# Patient Record
Sex: Female | Born: 1965 | Race: Black or African American | Hispanic: No | Marital: Married | Smoking: Never smoker
Health system: Southern US, Community
[De-identification: ages and names within clinical notes are randomized; demographics above are authoritative.]

## PROBLEM LIST (undated history)

## (undated) DIAGNOSIS — J45909 Unspecified asthma, uncomplicated: Secondary | ICD-10-CM

## (undated) DIAGNOSIS — R569 Unspecified convulsions: Secondary | ICD-10-CM

## (undated) DIAGNOSIS — F410 Panic disorder [episodic paroxysmal anxiety] without agoraphobia: Secondary | ICD-10-CM

## (undated) HISTORY — PX: COLON SURGERY: SHX602

---

## 2009-06-08 ENCOUNTER — Emergency Department (HOSPITAL_BASED_OUTPATIENT_CLINIC_OR_DEPARTMENT_OTHER): Admission: EM | Admit: 2009-06-08 | Discharge: 2009-06-08 | Payer: Self-pay | Admitting: Emergency Medicine

## 2010-07-04 LAB — DIFFERENTIAL
Basophils Relative: 1 % (ref 0–1)
Eosinophils Absolute: 0.1 10*3/uL (ref 0.0–0.7)
Monocytes Relative: 6 % (ref 3–12)
Neutrophils Relative %: 67 % (ref 43–77)

## 2010-07-04 LAB — BASIC METABOLIC PANEL
BUN: 16 mg/dL (ref 6–23)
CO2: 25 mEq/L (ref 19–32)
Chloride: 112 mEq/L (ref 96–112)
Creatinine, Ser: 1.1 mg/dL (ref 0.4–1.2)
Glucose, Bld: 112 mg/dL — ABNORMAL HIGH (ref 70–99)

## 2010-07-04 LAB — URINALYSIS, ROUTINE W REFLEX MICROSCOPIC
Bilirubin Urine: NEGATIVE
Specific Gravity, Urine: 1.029 (ref 1.005–1.030)
Urobilinogen, UA: 1 mg/dL (ref 0.0–1.0)

## 2010-07-04 LAB — TSH: TSH: 0.948 u[IU]/mL (ref 0.350–4.500)

## 2010-07-04 LAB — URINE MICROSCOPIC-ADD ON

## 2010-07-04 LAB — CBC
MCHC: 34.3 g/dL (ref 30.0–36.0)
MCV: 83.2 fL (ref 78.0–100.0)
Platelets: 227 10*3/uL (ref 150–400)

## 2010-07-04 LAB — CARBAMAZEPINE LEVEL, TOTAL: Carbamazepine Lvl: 8.5 ug/mL (ref 4.0–12.0)

## 2012-04-11 ENCOUNTER — Emergency Department (HOSPITAL_BASED_OUTPATIENT_CLINIC_OR_DEPARTMENT_OTHER): Payer: 59

## 2012-04-11 ENCOUNTER — Encounter (HOSPITAL_BASED_OUTPATIENT_CLINIC_OR_DEPARTMENT_OTHER): Payer: Self-pay | Admitting: *Deleted

## 2012-04-11 ENCOUNTER — Emergency Department (HOSPITAL_BASED_OUTPATIENT_CLINIC_OR_DEPARTMENT_OTHER)
Admission: EM | Admit: 2012-04-11 | Discharge: 2012-04-11 | Disposition: A | Discharge status: Home / Self Care | Payer: 59 | Attending: Emergency Medicine | Admitting: Emergency Medicine

## 2012-04-11 DIAGNOSIS — Z9889 Other specified postprocedural states: Secondary | ICD-10-CM | POA: Insufficient documentation

## 2012-04-11 DIAGNOSIS — K921 Melena: Secondary | ICD-10-CM | POA: Insufficient documentation

## 2012-04-11 DIAGNOSIS — K5289 Other specified noninfective gastroenteritis and colitis: Secondary | ICD-10-CM | POA: Insufficient documentation

## 2012-04-11 DIAGNOSIS — R1110 Vomiting, unspecified: Secondary | ICD-10-CM | POA: Insufficient documentation

## 2012-04-11 DIAGNOSIS — K529 Noninfective gastroenteritis and colitis, unspecified: Secondary | ICD-10-CM

## 2012-04-11 DIAGNOSIS — Z8719 Personal history of other diseases of the digestive system: Secondary | ICD-10-CM | POA: Insufficient documentation

## 2012-04-11 DIAGNOSIS — G40909 Epilepsy, unspecified, not intractable, without status epilepticus: Secondary | ICD-10-CM | POA: Insufficient documentation

## 2012-04-11 DIAGNOSIS — R197 Diarrhea, unspecified: Secondary | ICD-10-CM | POA: Insufficient documentation

## 2012-04-11 DIAGNOSIS — Z3202 Encounter for pregnancy test, result negative: Secondary | ICD-10-CM | POA: Insufficient documentation

## 2012-04-11 DIAGNOSIS — Z79899 Other long term (current) drug therapy: Secondary | ICD-10-CM | POA: Insufficient documentation

## 2012-04-11 HISTORY — DX: Unspecified convulsions: R56.9

## 2012-04-11 LAB — URINE MICROSCOPIC-ADD ON

## 2012-04-11 LAB — BASIC METABOLIC PANEL
Calcium: 9.1 mg/dL (ref 8.4–10.5)
Creatinine, Ser: 1 mg/dL (ref 0.50–1.10)
GFR calc non Af Amer: 67 mL/min — ABNORMAL LOW (ref >90)
Sodium: 136 mEq/L (ref 135–145)

## 2012-04-11 LAB — URINALYSIS, ROUTINE W REFLEX MICROSCOPIC
Ketones, ur: NEGATIVE mg/dL
Leukocytes, UA: NEGATIVE
Nitrite: NEGATIVE
Protein, ur: 30 mg/dL — AB
Urobilinogen, UA: 0.2 mg/dL (ref 0.0–1.0)
pH: 5 (ref 5.0–8.0)

## 2012-04-11 LAB — CBC WITH DIFFERENTIAL/PLATELET
Basophils Relative: 0 % (ref 0–1)
Eosinophils Absolute: 0.3 10*3/uL (ref 0.0–0.7)
Eosinophils Relative: 4 % (ref 0–5)
MCH: 27.1 pg (ref 26.0–34.0)
MCHC: 33.6 g/dL (ref 30.0–36.0)
Neutrophils Relative %: 55 % (ref 43–77)
Platelets: 276 10*3/uL (ref 150–400)
RBC: 4.69 MIL/uL (ref 3.87–5.11)
RDW: 13.6 % (ref 11.5–15.5)

## 2012-04-11 MED ORDER — IOHEXOL 300 MG/ML  SOLN
100.0000 mL | Freq: Once | INTRAMUSCULAR | Status: AC | PRN
  Administered 2012-04-11: 100 mL via INTRAVENOUS

## 2012-04-11 MED ORDER — ONDANSETRON 4 MG PO TBDP: 4.0000 mg | ORAL_TABLET | Freq: Three times a day (TID) | ORAL | Status: DC | PRN

## 2012-04-11 MED ORDER — CIPROFLOXACIN IN D5W 400 MG/200ML IV SOLN
400.0000 mg | Freq: Two times a day (BID) | INTRAVENOUS | Status: DC
  Administered 2012-04-11: 400 mg via INTRAVENOUS
  Filled 2012-04-11: qty 200

## 2012-04-11 MED ORDER — METRONIDAZOLE 500 MG PO TABS: 500.0000 mg | ORAL_TABLET | Freq: Two times a day (BID) | ORAL | Status: DC

## 2012-04-11 MED ORDER — METRONIDAZOLE IN NACL 5-0.79 MG/ML-% IV SOLN
500.0000 mg | Freq: Once | INTRAVENOUS | Status: DC
  Filled 2012-04-11: qty 100

## 2012-04-11 MED ORDER — ONDANSETRON HCL 4 MG/2ML IJ SOLN
INTRAMUSCULAR | Status: AC
  Administered 2012-04-11: 4 mg
  Filled 2012-04-11: qty 2

## 2012-04-11 MED ORDER — HYDROCODONE-ACETAMINOPHEN 5-325 MG PO TABS: 2.0000 | ORAL_TABLET | ORAL | Status: DC | PRN

## 2012-04-11 MED ORDER — CIPROFLOXACIN HCL 500 MG PO TABS: 500.0000 mg | ORAL_TABLET | Freq: Two times a day (BID) | ORAL | Status: DC

## 2012-04-11 MED ORDER — ONDANSETRON HCL 4 MG/2ML IJ SOLN: 4.0000 mg | Freq: Once | INTRAMUSCULAR | Status: DC

## 2012-04-11 MED ORDER — IOHEXOL 300 MG/ML  SOLN: 50.0000 mL | Freq: Once | INTRAMUSCULAR | Status: DC | PRN

## 2012-04-11 NOTE — ED Provider Notes (Signed)
History     CSN: [id number]  Arrival date & time 04/11/12  1203   First MD Initiated Contact with Patient 04/11/12 1229      Chief Complaint  Patient presents with  . Abdominal Pain    (Consider location/radiation/quality/duration/timing/severity/associated sxs/prior treatment) Patient is a 47 y.o. female presenting with abdominal pain. The history is provided by the patient. No language interpreter was used.  Abdominal Pain The primary symptoms of the illness include abdominal pain, vomiting and diarrhea. Episode onset: 1 week. The onset of the illness was gradual. The problem has been gradually worsening.  The illness is associated with a recent illness. The patient states that she believes she is currently not pregnant. The patient has had a change in bowel habit. Significant associated medical issues include inflammatory bowel disease.  Pt complains of lower abdominal pain, vomiting and diarrhea.  Pt noticed some blood in diarrhea today  Past Medical History  Diagnosis Date  . Seizures     Past Surgical History  Procedure Date  . Colon surgery   . Cesarean section     History reviewed. No pertinent family history.  History  Substance Use Topics  . Smoking status: Never Smoker   . Smokeless tobacco: Not on file  . Alcohol Use: No    OB History    Grav Para Term Preterm Abortions TAB SAB Ect Mult Living                  Review of Systems  Gastrointestinal: Positive for vomiting, abdominal pain and diarrhea.  All other systems reviewed and are negative.    Allergies  Review of patient's allergies indicates no known allergies.  Home Medications   Current Outpatient Rx  Name  Route  Sig  Dispense  Refill  . TEGRETOL PO   Oral   Take by mouth.           BP 151/93  Pulse 97  Temp 98.8 F (37.1 C) (Oral)  Resp 14  Ht 5\' 5"  (1.651 m)  Wt 177 lb (80.287 kg)  BMI 29.45 kg/m2  SpO2 100%  LMP 03/30/2012  Physical Exam  Vitals  reviewed. Constitutional: She is oriented to person, place, and time. She appears well-developed and well-nourished.  HENT:  Head: Normocephalic and atraumatic.  Right Ear: External ear normal.  Left Ear: External ear normal.  Nose: Nose normal.  Mouth/Throat: Oropharynx is clear and moist.  Eyes: Conjunctivae normal and EOM are normal. Pupils are equal, round, and reactive to light.  Neck: Normal range of motion. Neck supple.  Cardiovascular: Normal rate and normal heart sounds.   Pulmonary/Chest: Effort normal.  Abdominal: Soft. There is tenderness. There is guarding.  Musculoskeletal: Normal range of motion.  Neurological: She is alert and oriented to person, place, and time.  Skin: Skin is warm.  Psychiatric: She has a normal mood and affect.    ED Course  Procedures (including critical care time)  Labs Reviewed  URINALYSIS, ROUTINE W REFLEX MICROSCOPIC - Abnormal; Notable for the following:    Hgb urine dipstick LARGE (*)     Bilirubin Urine SMALL (*)     Protein, ur 30 (*)     All other components within normal limits  CBC WITH DIFFERENTIAL - Abnormal; Notable for the following:    Monocytes Relative 14 (*)     All other components within normal limits  BASIC METABOLIC PANEL - Abnormal; Notable for the following:    Potassium 3.4 (*)  GFR calc non Af Amer 67 (*)     GFR calc Af Amer 78 (*)     All other components within normal limits  URINE MICROSCOPIC-ADD ON - Abnormal; Notable for the following:    Squamous Epithelial / LPF FEW (*)     Bacteria, UA MANY (*)     All other components within normal limits  PREGNANCY, URINE   Ct Abdomen Pelvis W Contrast  04/11/2012  *RADIOLOGY REPORT*  Clinical Data: Abdominal cramping, nausea/vomiting/diarrhea, blood in stool.  CT ABDOMEN AND PELVIS WITH CONTRAST  Technique:  Multidetector CT imaging of the abdomen and pelvis was performed following the standard protocol during bolus administration of intravenous contrast.   Contrast: OMNIPAQUE IOHEXOL 300 MG/ML  SOLN  Comparison: None.  Findings: Lung bases are clear.  Small hiatal hernia.  Liver, spleen, pancreas, and adrenal glands are within normal limits.  Layering small gallstones (series 2/image 27), without associate inflammatory changes by CT.  No intrahepatic or extrahepatic ductal dilatation.  4 mm probable left lower pole renal cyst (series 7/image 20). Right kidney is unremarkable.  No hydronephrosis.  No evidence of bowel obstruction.  Stool in the distal/terminal ileum, suggesting small bowel stasis.  Terminal ileum and cecum are mildly thick-walled (series 2/image 51 and 58), suggesting infectious/inflammatory enterocolitis.  Left colon is decompressed.  No evidence of abdominal aortic aneurysm.  No abdominopelvic ascites.  Small right lower quadrant lymph nodes measuring up to 8 mm short axis (series 2/image 50), possibly reactive.  Uterus and right ovary are unremarkable.  5.3 x 3.9 cm left ovarian cyst, likely mildly complex/hemorrhagic.  Bladder is within normal limits.  Very mild degenerative changes of the lumbar spine.  IMPRESSION: Bowel wall thickening involving the terminal ileum and cecum, suggesting infectious/inflammatory enterocolitis.  Consider endoscopic correlation.  No evidence of bowel obstruction.  Cholelithiasis, without associated inflammatory changes by CT.  5.3 cm left ovarian cyst, likely hemorrhagic.  Follow-up pelvic ultrasound is suggested in 6-12 weeks.   Original Report Authenticated By: Charline Bills, M.D.      No diagnosis found.    MDM  Pt counseled on results,  I advised her she will need follow up with her gyn for ultrasound of ovarain cyst.   Pt advised to see general surgeon for evaluation of gallstones,  I will treat bowel inflammation with flagyl and cipro.   Pt given IV dosage here.  Pt advised to see her primary Md for recheck in 2-3 days  Pt given rx for cipro, flagyl,  hydrococone and  zofran        Lonia Skinner [place], [place] 04/11/12 1735

## 2012-04-11 NOTE — ED Notes (Signed)
Started chills, headache, body aches a week ago. Was seen by her MD at [facility] and diagnosed with a virus. Since that time has been having urinary frequency, frequent liquid stools  with blood and vomiting. States she does see a few specks of blood in her emesis. Abdominal cramping until yesterday. Now only cramping with bowel movements. She went back to her MD today and was sent her for evaluation.

## 2012-04-11 NOTE — ED Notes (Signed)
Patient refusing Metronidazole (Flagyl) antibiotic. Patient states "Can I not take this PO, I don't want to stay here any longer.". Clydie Braun, PA will be notified.

## 2012-04-16 ENCOUNTER — Emergency Department (HOSPITAL_BASED_OUTPATIENT_CLINIC_OR_DEPARTMENT_OTHER)
Admission: EM | Admit: 2012-04-16 | Discharge: 2012-04-16 | Disposition: A | Discharge status: Home / Self Care | Payer: 59 | Attending: Emergency Medicine | Admitting: Emergency Medicine

## 2012-04-16 ENCOUNTER — Encounter (HOSPITAL_BASED_OUTPATIENT_CLINIC_OR_DEPARTMENT_OTHER): Payer: Self-pay | Admitting: Family Medicine

## 2012-04-16 ENCOUNTER — Emergency Department (HOSPITAL_BASED_OUTPATIENT_CLINIC_OR_DEPARTMENT_OTHER): Payer: 59

## 2012-04-16 DIAGNOSIS — R51 Headache: Secondary | ICD-10-CM | POA: Insufficient documentation

## 2012-04-16 DIAGNOSIS — Z79899 Other long term (current) drug therapy: Secondary | ICD-10-CM | POA: Insufficient documentation

## 2012-04-16 DIAGNOSIS — K5289 Other specified noninfective gastroenteritis and colitis: Secondary | ICD-10-CM | POA: Insufficient documentation

## 2012-04-16 DIAGNOSIS — F410 Panic disorder [episodic paroxysmal anxiety] without agoraphobia: Secondary | ICD-10-CM | POA: Insufficient documentation

## 2012-04-16 DIAGNOSIS — G40909 Epilepsy, unspecified, not intractable, without status epilepticus: Secondary | ICD-10-CM | POA: Insufficient documentation

## 2012-04-16 DIAGNOSIS — R569 Unspecified convulsions: Secondary | ICD-10-CM

## 2012-04-16 HISTORY — DX: Panic disorder (episodic paroxysmal anxiety): F41.0

## 2012-04-16 LAB — URINALYSIS, ROUTINE W REFLEX MICROSCOPIC
Glucose, UA: NEGATIVE mg/dL
Ketones, ur: NEGATIVE mg/dL
Protein, ur: NEGATIVE mg/dL
Urobilinogen, UA: 0.2 mg/dL (ref 0.0–1.0)

## 2012-04-16 LAB — COMPREHENSIVE METABOLIC PANEL
AST: 16 U/L (ref 0–37)
Albumin: 3.4 g/dL — ABNORMAL LOW (ref 3.5–5.2)
Alkaline Phosphatase: 71 U/L (ref 39–117)
BUN: 6 mg/dL (ref 6–23)
CO2: 26 mEq/L (ref 19–32)
Chloride: 102 mEq/L (ref 96–112)
Creatinine, Ser: 1 mg/dL (ref 0.50–1.10)
GFR calc non Af Amer: 67 mL/min — ABNORMAL LOW (ref >90)
Potassium: 3.2 mEq/L — ABNORMAL LOW (ref 3.5–5.1)
Total Bilirubin: 0.1 mg/dL — ABNORMAL LOW (ref 0.3–1.2)

## 2012-04-16 LAB — CBC
HCT: 33.7 % — ABNORMAL LOW (ref 36.0–46.0)
Hemoglobin: 11.1 g/dL — ABNORMAL LOW (ref 12.0–15.0)
MCV: 81.2 fL (ref 78.0–100.0)
RBC: 4.15 MIL/uL (ref 3.87–5.11)
RDW: 13.3 % (ref 11.5–15.5)
WBC: 13 10*3/uL — ABNORMAL HIGH (ref 4.0–10.5)

## 2012-04-16 LAB — URINE MICROSCOPIC-ADD ON

## 2012-04-16 MED ORDER — DIPHENHYDRAMINE HCL 50 MG/ML IJ SOLN
25.0000 mg | Freq: Once | INTRAMUSCULAR | Status: AC
  Administered 2012-04-16: 25 mg via INTRAVENOUS
  Filled 2012-04-16: qty 1

## 2012-04-16 MED ORDER — LORAZEPAM 2 MG/ML IJ SOLN
1.0000 mg | Freq: Once | INTRAMUSCULAR | Status: AC
  Administered 2012-04-16: 1 mg via INTRAVENOUS
  Filled 2012-04-16: qty 1

## 2012-04-16 MED ORDER — METOCLOPRAMIDE HCL 5 MG/ML IJ SOLN
10.0000 mg | Freq: Once | INTRAMUSCULAR | Status: AC
  Administered 2012-04-16: 10 mg via INTRAVENOUS
  Filled 2012-04-16: qty 2

## 2012-04-16 MED ORDER — SODIUM CHLORIDE 0.9 % IV BOLUS (SEPSIS)
1000.0000 mL | Freq: Once | INTRAVENOUS | Status: AC
  Administered 2012-04-16: 1000 mL via INTRAVENOUS

## 2012-04-16 MED ORDER — POTASSIUM CHLORIDE CRYS ER 20 MEQ PO TBCR
40.0000 meq | EXTENDED_RELEASE_TABLET | Freq: Once | ORAL | Status: AC
  Administered 2012-04-16: 40 meq via ORAL
  Filled 2012-04-16: qty 2

## 2012-04-16 NOTE — ED Notes (Signed)
Pt. Is non compliant with taking her tegretol and reports to rn she did not take her dose this morning.  Pt. Awake and now aware she had a seizure and is able to speak full sentences.  Pt. Walked around nurses stating with steady gait and no distress noted.

## 2012-04-16 NOTE — ED Notes (Signed)
Pt. HR is 82-84 SR with no ectopy noted.  Pt. Is awake and speaking before traveling to CT scanner.

## 2012-04-16 NOTE — ED Notes (Signed)
At time of discharge the Pt. Is unable to recite the day and the year and the month.  Pt. Draws on signature pad instead of writing her name.  Pt. Asked to sign again and she did.  Pt. Laughs instead of answering questions.  Informed Dr. Karma Ganja of Pt. Status and Pt. Will stay a little longer.

## 2012-04-16 NOTE — ED Notes (Addendum)
Pt. Just had seizure activity at approx. 5 mins ago.  Pt. Head turned to the R and she began to pull her head to the R several times with sounds from her mouth.  Pt. Now in a postictal state.  Pt. Husband at bedside.  Dr. Karma Ganja called to room to assess the Pt.    Pt. Husband reports the Pt. Has not had a seizure since 2012

## 2012-04-16 NOTE — ED Notes (Signed)
Pt. Has soft side boards on bedside bilat. For seizure activity.  No wounds in mouth noted after the seizure and no coughing or drooling noted

## 2012-04-16 NOTE — ED Notes (Signed)
Pt. Has gone to CT with CT [place] and will return shortly.  Pt. On monitor and off just for the CT Scan.

## 2012-04-16 NOTE — ED Provider Notes (Signed)
History     CSN: [id number]  Arrival date & time 04/16/12  4401   First MD Initiated Contact with Patient 04/16/12 1846      Chief Complaint  Patient presents with  . Migraine    (Consider location/radiation/quality/duration/timing/severity/associated sxs/prior treatment) HPI  A LEVEL 5 CAVEAT PERTAINS DUE TO ALTERED MENTAL STATUS Pt with hx of seizure disorder and recent diagnosis of colitis currently taking cipro and flagyl presents with c/o headache.  Upon arrival to the ED patient had a  GTC seizure.  Husband states that she does have hx of seizures.  He also states she is still taking cipro and flagyl for a colitis that was diagnosed last week.  He states that she has been eating and drinking well and missed a few days of her meds while she was sick- but had started back on her regular tegretol dose several days ago.  She has also been having  An increase in panic attacks and saw her doctor yesterday and was started on xanax.  Today was her first day back to work- she complained of a frontal throbbing headache and left work early.   Past Medical History  Diagnosis Date  . Seizures   . Panic attacks     Past Surgical History  Procedure Date  . Colon surgery   . Cesarean section     No family history on file.  History  Substance Use Topics  . Smoking status: Never Smoker   . Smokeless tobacco: Not on file  . Alcohol Use: No    OB History    Grav Para Term Preterm Abortions TAB SAB Ect Mult Living                  Review of Systems ROS reviewed and all otherwise negative except for mentioned in HPI  Allergies  Review of patient's allergies indicates no known allergies.  Home Medications   Current Outpatient Rx  Name  Route  Sig  Dispense  Refill  . XANAX PO   Oral   Take by mouth.         . TEGRETOL PO   Oral   Take by mouth.         Marland Kitchen CIPROFLOXACIN HCL 500 MG PO TABS   Oral   Take 1 tablet (500 mg total) by mouth every 12 (twelve) hours.   20  tablet   0   . HYDROCODONE-ACETAMINOPHEN 5-325 MG PO TABS   Oral   Take 2 tablets by mouth every 4 (four) hours as needed for pain.   10 tablet   0   . METRONIDAZOLE 500 MG PO TABS   Oral   Take 1 tablet (500 mg total) by mouth 2 (two) times daily.   20 tablet   0   . ONDANSETRON 4 MG PO TBDP   Oral   Take 1 tablet (4 mg total) by mouth every 8 (eight) hours as needed for nausea.   20 tablet   0     BP 149/85  Pulse 92  Temp 98.2 F (36.8 C) (Oral)  Resp 20  SpO2 99%  LMP 03/30/2012 Vitals reviewed Physical Exam Physical Examination: General appearance - alert, tired appearing, and in no distress after seizure activity stopped Mental status - alert, oriented to person, place, and time Eyes - pupils equal and reactive, extraocular eye movements intact Mouth - mucous membranes moist, pharynx normal without lesions Chest - clear to auscultation, no wheezes, rales or rhonchi, symmetric  air entry Heart - normal rate, regular rhythm, normal S1, S2, no murmurs, rubs, clicks or gallops Abdomen - soft, nontender, nondistended, no masses or organomegaly Neurological - somnolent immediately after seizure and post ictal- after observation she was alert, oriented and able to answer questions, cranial nerves 2-12 tested and intact, strength 5/5 in extrmeities x 4, sensation intact Extremities - peripheral pulses normal, no pedal edema, no clubbing or cyanosis Skin - normal coloration and turgor, no rashes  ED Course  Procedures (including critical care time)   Date: 04/16/2012  Rate: 85  Rhythm: normal sinus rhythm  QRS Axis: normal  Intervals: normal  ST/T Wave abnormalities: normal  Conduction Disutrbances: none  Narrative Interpretation: poor r wave progression, unchanged compared to prior ekg of 06/08/09  9:30 PM pt feeling improved, she has been up and ambulated around the ED.         CRITICAL CARE Performed by: Ethelda Chick   Total critical care time:  35  Critical care time was exclusive of separately billable procedures and treating other patients.  Critical care was necessary to treat or prevent imminent or life-threatening deterioration.  Critical care was time spent personally by me on the following activities: development of treatment plan with patient and/or surrogate as well as nursing, discussions with consultants, evaluation of patient's response to treatment, examination of patient, obtaining history from patient or surrogate, ordering and performing treatments and interventions, ordering and review of laboratory studies, ordering and review of radiographic studies, pulse oximetry and re-evaluation of patient's condition.  Labs Reviewed  CBC - Abnormal; Notable for the following:    WBC 13.0 (*)     Hemoglobin 11.1 (*)     HCT 33.7 (*)     All other components within normal limits  COMPREHENSIVE METABOLIC PANEL - Abnormal; Notable for the following:    Potassium 3.2 (*)     Glucose, Bld 103 (*)     Albumin 3.4 (*)     Total Bilirubin 0.1 (*)     GFR calc non Af Amer 67 (*)     GFR calc Af Amer 78 (*)     All other components within normal limits  URINALYSIS, ROUTINE W REFLEX MICROSCOPIC - Abnormal; Notable for the following:    Hgb urine dipstick SMALL (*)     All other components within normal limits  URINE MICROSCOPIC-ADD ON   Ct Head Wo Contrast  04/16/2012  *RADIOLOGY REPORT*  Clinical Data: 47 year old female with altered mental status and headache.  CT HEAD WITHOUT CONTRAST  Technique:  Contiguous axial images were obtained from the base of the skull through the vertex without contrast.  Comparison: None  Findings: No intracranial abnormalities are identified, including mass lesion or mass effect, hydrocephalus, extra-axial fluid collection, midline shift, hemorrhage, or acute infarction.  The visualized bony calvarium is unremarkable.  IMPRESSION: Unremarkable noncontrast head CT.   Original Report Authenticated By:  Harmon Pier, M.D.      1. Headache   2. Seizure       MDM  Pt presenting with c/o headache- had GTC seizure in ED- has hx of seizure disorder.  Is taking cipro/flagyl for colitis diagnosed last week- these symptoms are much improved.  Pt placed on monitor, IV accesss obtained, treated with ativan IV for seizure activity.  After observation pt returned to her baseline mental status, ambulated in the ED.  Was instructed to take her tegretol as prescribed and arrange for f/u with her neurologist.  Discharged with strict  return precautions.  Pt agreeable with plan.        Ethelda Chick, MD 04/16/12 [phone]

## 2012-04-16 NOTE — ED Notes (Signed)
Family at bedside. 

## 2012-04-16 NOTE — ED Notes (Addendum)
Pt c/o migraine worse than in past since this morning. Pt sts she has been taking meds for infection. Husband sts pt has been having panic attacks and "blacking out at times" since yesterday.

## 2012-04-16 NOTE — ED Notes (Signed)
Pt. Able to speak full sentences at time of discharge.  Pt sleeping upon final assessment and awakens easily with no noted distress.  Pt. Able to recite the day and aware of why she was here.

## 2012-04-21 NOTE — ED Provider Notes (Signed)
History/physical exam/procedure(s) were performed by non-physician practitioner and as supervising physician I was immediately available for consultation/collaboration. I have reviewed all notes and am in agreement with care and plan.   Birtie Fellman S Mareesa Gathright, MD 04/21/12 1945 

## 2013-12-28 IMAGING — CT CT HEAD W/O CM
1 series · 16 of 30 positions shown, 20 images · non-contrast
Comparison: None

CLINICAL DATA: 46-year-old female with altered mental status and
headache.

CT HEAD WITHOUT CONTRAST
TECHNIQUE: Contiguous axial images were obtained from the base of
the skull through the vertex without contrast.

[Series 3: head 4.8 h37f · axial · 0.45mm/px · z∈[-148,-12]mm · 16 of 32 slices shown, 20 images]
[im 2/32  brain]
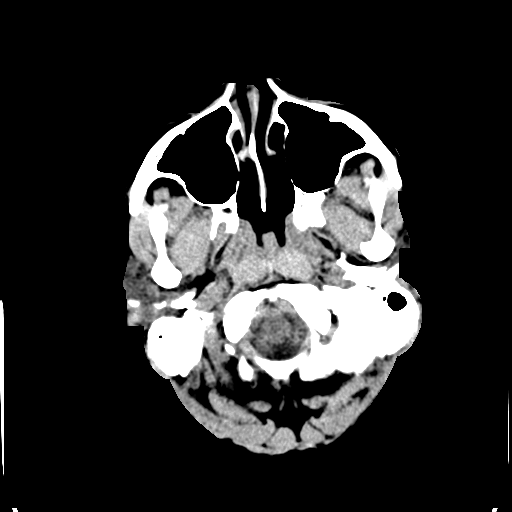
[im 2/32  bone]
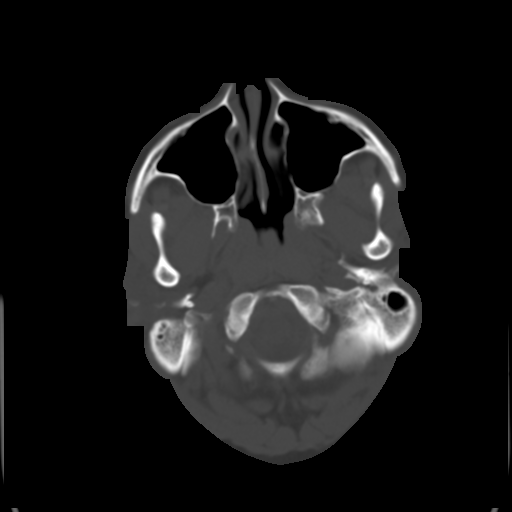
[im 4/32  brain]
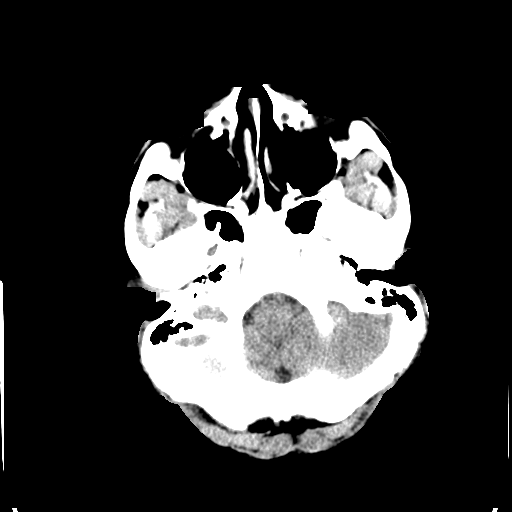
[im 6/32  brain]
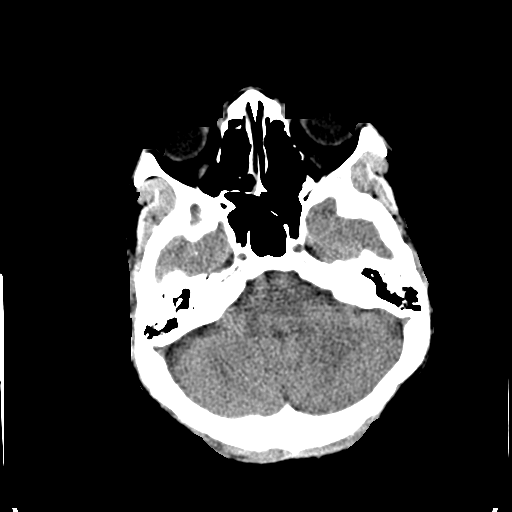
[im 8/32  brain]
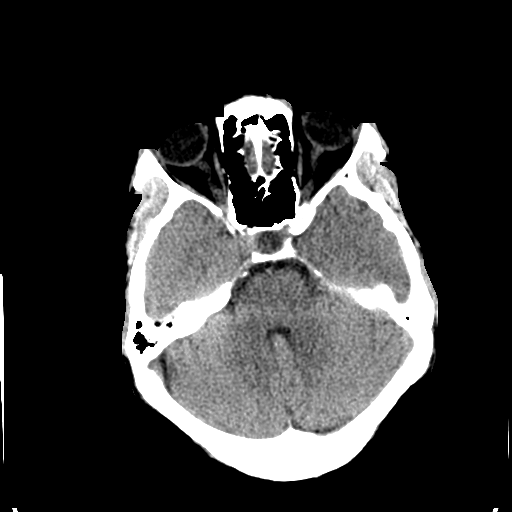
[im 9/32  brain]
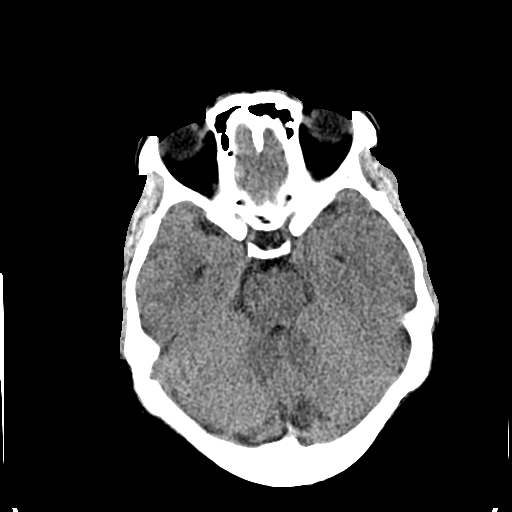
[im 9/32  bone]
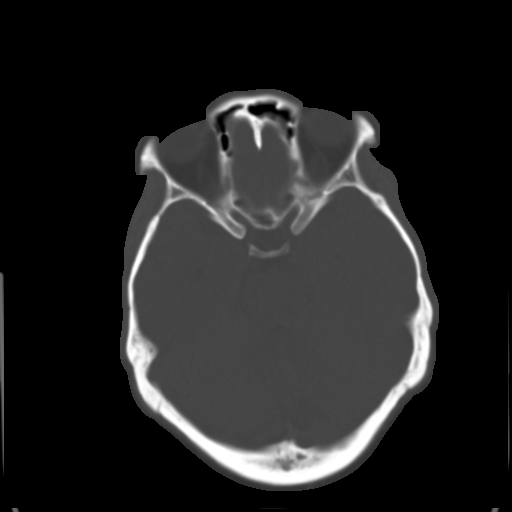
[im 11/32  brain]
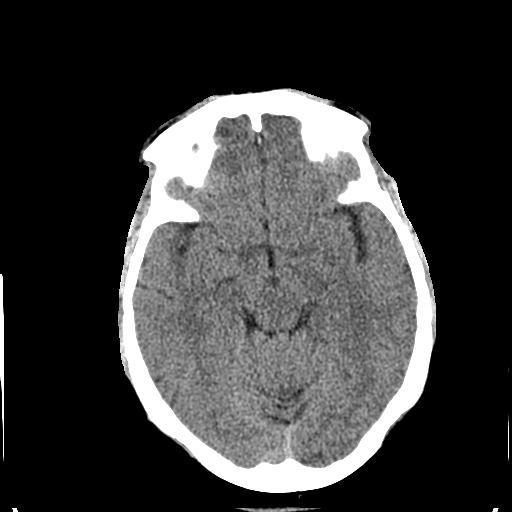
[im 13/32  brain]
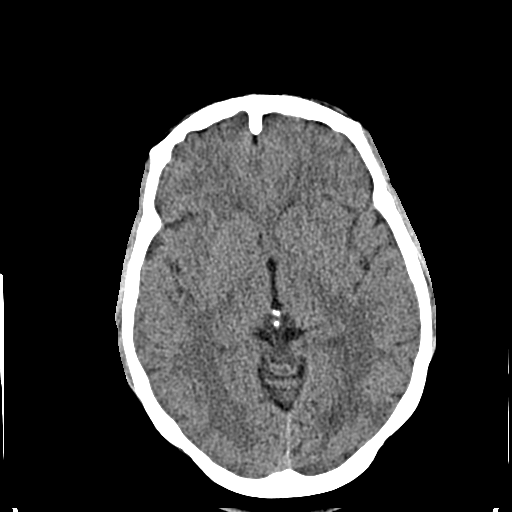
[im 15/32  brain]
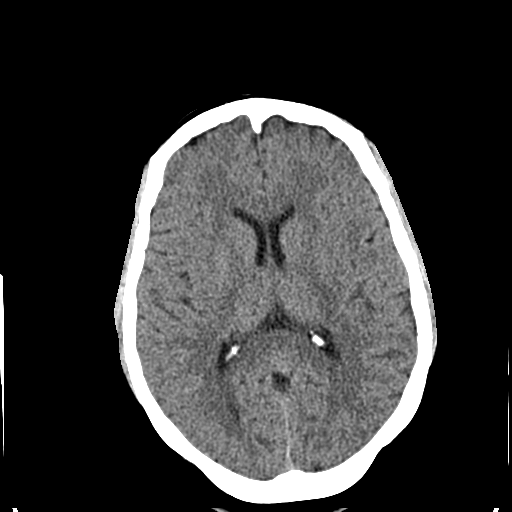
[im 17/32  brain]
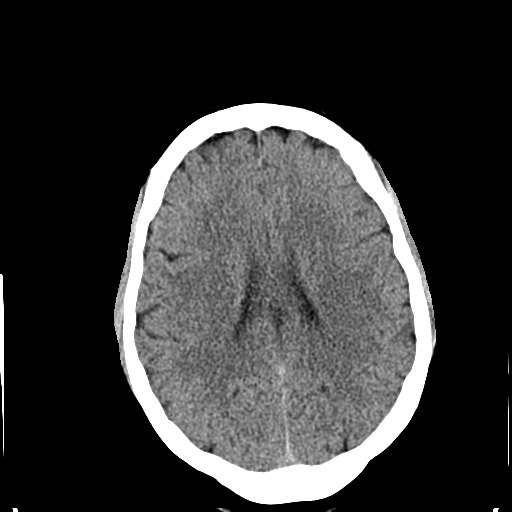
[im 17/32  bone]
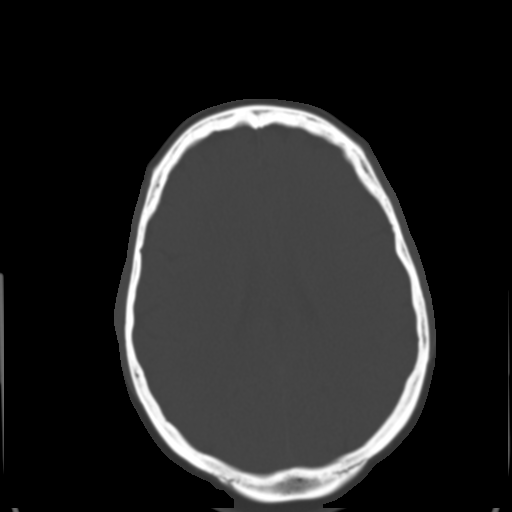
[im 19/32  brain]
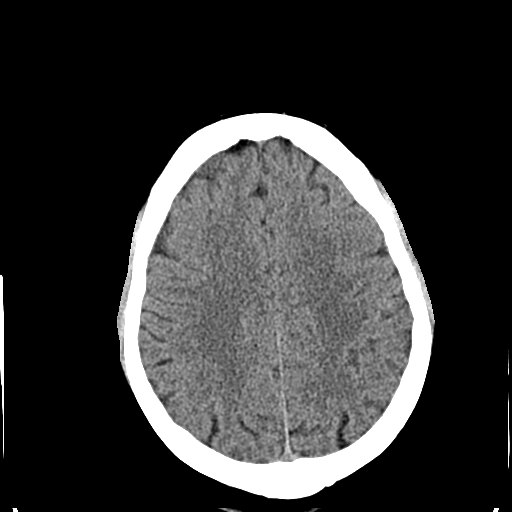
[im 21/32  brain]
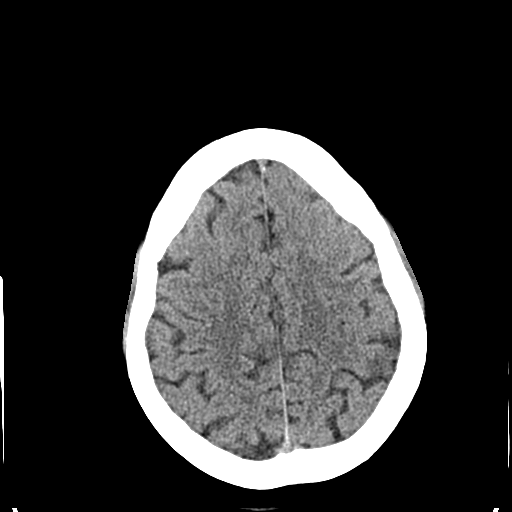
[im 23/32  brain]
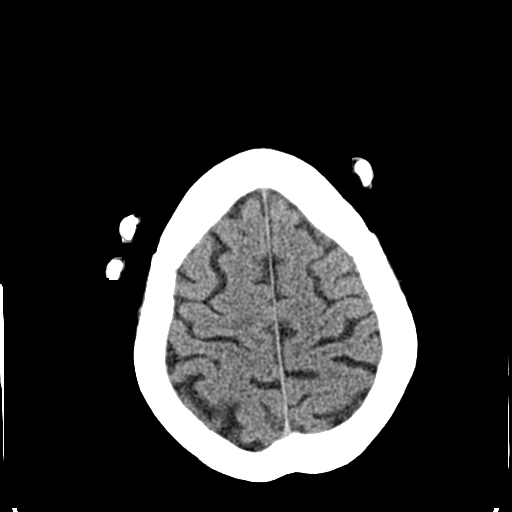
[im 24/32  brain]
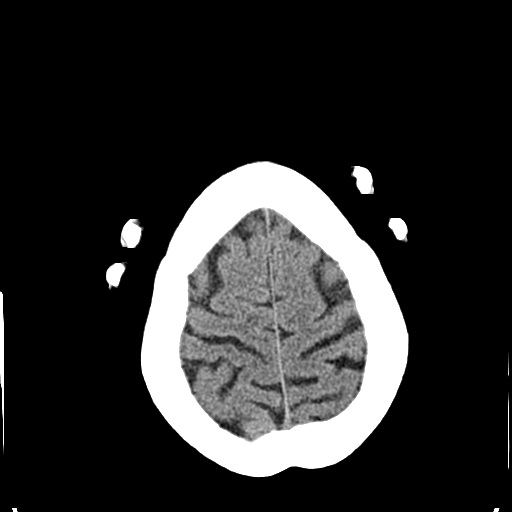
[im 24/32  bone]
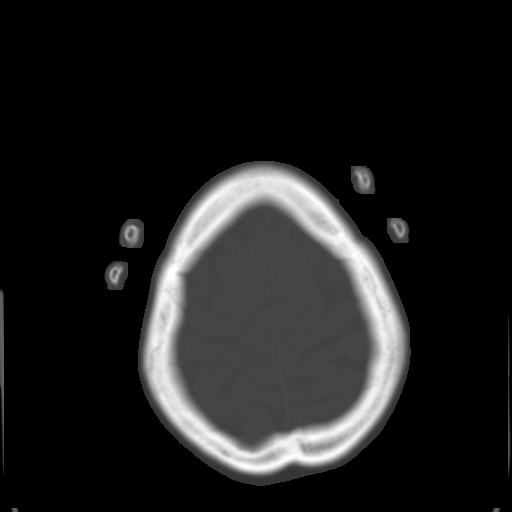
[im 26/32  brain]
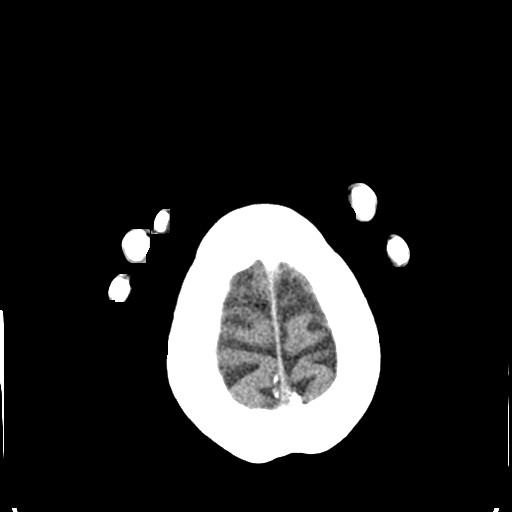
[im 28/32  brain]
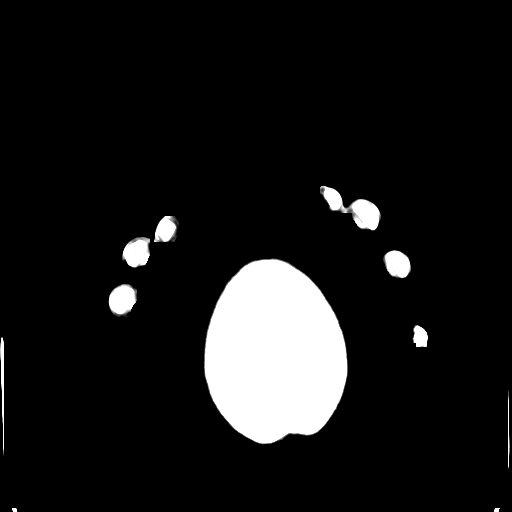
[im 30/32  brain]
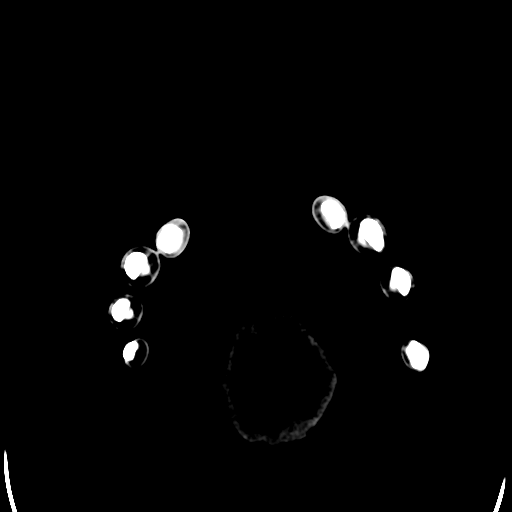

[16 of 30 positions shown; findings below may reference images not displayed]

FINDINGS: No intracranial abnormalities are identified, including
mass lesion or mass effect, hydrocephalus, extra-axial fluid
collection, midline shift, hemorrhage, or acute infarction.

The visualized bony calvarium is unremarkable.
IMPRESSION: Unremarkable noncontrast head CT.

## 2014-06-30 ENCOUNTER — Encounter (HOSPITAL_BASED_OUTPATIENT_CLINIC_OR_DEPARTMENT_OTHER): Payer: Self-pay

## 2014-06-30 ENCOUNTER — Inpatient Hospital Stay (HOSPITAL_BASED_OUTPATIENT_CLINIC_OR_DEPARTMENT_OTHER)
Admission: EM | Admit: 2014-06-30 | Discharge: 2014-07-02 | DRG: 872 | Disposition: A | Discharge status: Home / Self Care | Payer: 59 | Attending: Internal Medicine | Admitting: Internal Medicine

## 2014-06-30 ENCOUNTER — Inpatient Hospital Stay (HOSPITAL_COMMUNITY): Payer: 59

## 2014-06-30 ENCOUNTER — Emergency Department (HOSPITAL_BASED_OUTPATIENT_CLINIC_OR_DEPARTMENT_OTHER): Payer: 59

## 2014-06-30 ENCOUNTER — Other Ambulatory Visit: Payer: Self-pay | Admitting: Emergency Medicine

## 2014-06-30 DIAGNOSIS — J101 Influenza due to other identified influenza virus with other respiratory manifestations: Secondary | ICD-10-CM | POA: Diagnosis present

## 2014-06-30 DIAGNOSIS — R509 Fever, unspecified: Secondary | ICD-10-CM | POA: Diagnosis present

## 2014-06-30 DIAGNOSIS — J3489 Other specified disorders of nose and nasal sinuses: Secondary | ICD-10-CM

## 2014-06-30 DIAGNOSIS — Z79899 Other long term (current) drug therapy: Secondary | ICD-10-CM | POA: Diagnosis not present

## 2014-06-30 DIAGNOSIS — G40909 Epilepsy, unspecified, not intractable, without status epilepticus: Secondary | ICD-10-CM | POA: Diagnosis present

## 2014-06-30 DIAGNOSIS — I10 Essential (primary) hypertension: Secondary | ICD-10-CM | POA: Diagnosis present

## 2014-06-30 DIAGNOSIS — D61818 Other pancytopenia: Secondary | ICD-10-CM

## 2014-06-30 DIAGNOSIS — R6510 Systemic inflammatory response syndrome (SIRS) of non-infectious origin without acute organ dysfunction: Secondary | ICD-10-CM | POA: Diagnosis present

## 2014-06-30 DIAGNOSIS — N179 Acute kidney failure, unspecified: Secondary | ICD-10-CM | POA: Diagnosis present

## 2014-06-30 DIAGNOSIS — A419 Sepsis, unspecified organism: Secondary | ICD-10-CM | POA: Diagnosis not present

## 2014-06-30 DIAGNOSIS — R69 Illness, unspecified: Secondary | ICD-10-CM

## 2014-06-30 DIAGNOSIS — F419 Anxiety disorder, unspecified: Secondary | ICD-10-CM | POA: Diagnosis present

## 2014-06-30 DIAGNOSIS — M7989 Other specified soft tissue disorders: Secondary | ICD-10-CM | POA: Diagnosis not present

## 2014-06-30 DIAGNOSIS — R569 Unspecified convulsions: Secondary | ICD-10-CM

## 2014-06-30 DIAGNOSIS — J111 Influenza due to unidentified influenza virus with other respiratory manifestations: Secondary | ICD-10-CM | POA: Diagnosis not present

## 2014-06-30 LAB — CBC WITH DIFFERENTIAL/PLATELET
BASOS ABS: 0 10*3/uL (ref 0.0–0.1)
Band Neutrophils: 2 % (ref 0–10)
Basophils Absolute: 0 10*3/uL (ref 0.0–0.1)
Basophils Relative: 0 % (ref 0–1)
Basophils Relative: 0 % (ref 0–1)
EOS ABS: 0 10*3/uL (ref 0.0–0.7)
EOS PCT: 0 % (ref 0–5)
Eosinophils Absolute: 0 10*3/uL (ref 0.0–0.7)
Eosinophils Relative: 1 % (ref 0–5)
HEMATOCRIT: 28.2 % — AB (ref 36.0–46.0)
HEMATOCRIT: 26 % — AB (ref 36.0–46.0)
HEMOGLOBIN: 9.3 g/dL — AB (ref 12.0–15.0)
Hemoglobin: 8.4 g/dL — ABNORMAL LOW (ref 12.0–15.0)
LYMPHS PCT: 17 % (ref 12–46)
Lymphocytes Relative: 12 % (ref 12–46)
Lymphs Abs: 0.4 10*3/uL — ABNORMAL LOW (ref 0.7–4.0)
Lymphs Abs: 0.6 10*3/uL — ABNORMAL LOW (ref 0.7–4.0)
MCH: 27.8 pg (ref 26.0–34.0)
MCH: 27.3 pg (ref 26.0–34.0)
MCHC: 33 g/dL (ref 30.0–36.0)
MCHC: 32.3 g/dL (ref 30.0–36.0)
MCV: 84.4 fL (ref 78.0–100.0)
MCV: 84.4 fL (ref 78.0–100.0)
MONO ABS: 0.1 10*3/uL (ref 0.1–1.0)
Monocytes Absolute: 0.3 10*3/uL (ref 0.1–1.0)
Monocytes Relative: 4 % (ref 3–12)
Monocytes Relative: 7 % (ref 3–12)
NEUTROS ABS: 2.6 10*3/uL (ref 1.7–7.7)
NEUTROS PCT: 82 % — AB (ref 43–77)
Neutro Abs: 3 10*3/uL (ref 1.7–7.7)
Neutrophils Relative %: 75 % (ref 43–77)
PLATELETS: 171 10*3/uL (ref 150–400)
Platelets: 93 10*3/uL — ABNORMAL LOW (ref 150–400)
RBC: 3.34 MIL/uL — ABNORMAL LOW (ref 3.87–5.11)
RBC: 3.08 MIL/uL — ABNORMAL LOW (ref 3.87–5.11)
RDW: 13 % (ref 11.5–15.5)
RDW: 13.5 % (ref 11.5–15.5)
WBC: 3.5 10*3/uL — ABNORMAL LOW (ref 4.0–10.5)
WBC: 3.5 10*3/uL — ABNORMAL LOW (ref 4.0–10.5)

## 2014-06-30 LAB — RETICULOCYTES
RBC.: 3.83 MIL/uL — ABNORMAL LOW (ref 3.87–5.11)
RETIC CT PCT: 1 % (ref 0.4–3.1)
Retic Count, Absolute: 38.3 10*3/uL (ref 19.0–186.0)

## 2014-06-30 LAB — DIC (DISSEMINATED INTRAVASCULAR COAGULATION) PANEL
D DIMER QUANT: 0.91 ug{FEU}/mL — AB (ref 0.00–0.48)
FIBRINOGEN: 394 mg/dL (ref 204–475)
PLATELETS: 198 10*3/uL (ref 150–400)
PROTHROMBIN TIME: 14.3 s (ref 11.6–15.2)
SMEAR REVIEW: NONE SEEN

## 2014-06-30 LAB — HEPATIC FUNCTION PANEL
ALK PHOS: 57 U/L (ref 39–117)
ALT: 13 U/L (ref 0–35)
AST: 16 U/L (ref 0–37)
Albumin: 3.2 g/dL — ABNORMAL LOW (ref 3.5–5.2)
TOTAL PROTEIN: 6.2 g/dL (ref 6.0–8.3)
Total Bilirubin: 0.4 mg/dL (ref 0.3–1.2)

## 2014-06-30 LAB — BASIC METABOLIC PANEL
ANION GAP: 9 (ref 5–15)
BUN: 21 mg/dL (ref 6–23)
CHLORIDE: 100 mmol/L (ref 96–112)
CO2: 24 mmol/L (ref 19–32)
CREATININE: 1.77 mg/dL — AB (ref 0.50–1.10)
Calcium: 8.9 mg/dL (ref 8.4–10.5)
GFR calc non Af Amer: 33 mL/min — ABNORMAL LOW (ref >90)
GFR, EST AFRICAN AMERICAN: 39 mL/min — AB (ref >90)
Glucose, Bld: 107 mg/dL — ABNORMAL HIGH (ref 70–99)
POTASSIUM: 4.3 mmol/L (ref 3.5–5.1)
Sodium: 133 mmol/L — ABNORMAL LOW (ref 135–145)

## 2014-06-30 LAB — LACTATE DEHYDROGENASE: LDH: 132 U/L (ref 94–250)

## 2014-06-30 LAB — DIC (DISSEMINATED INTRAVASCULAR COAGULATION)PANEL
INR: 1.11 (ref 0.00–1.49)
aPTT: 34 seconds (ref 24–37)

## 2014-06-30 LAB — CARBAMAZEPINE LEVEL, TOTAL: Carbamazepine Lvl: 6.4 ug/mL (ref 4.0–12.0)

## 2014-06-30 LAB — RAPID STREP SCREEN (MED CTR MEBANE ONLY): Streptococcus, Group A Screen (Direct): NEGATIVE

## 2014-06-30 LAB — LACTIC ACID, PLASMA: Lactic Acid, Venous: 1 mmol/L (ref 0.5–2.0)

## 2014-06-30 LAB — MONONUCLEOSIS SCREEN: MONO SCREEN: NEGATIVE

## 2014-06-30 LAB — PROCALCITONIN

## 2014-06-30 MED ORDER — SODIUM CHLORIDE 0.9 % IV SOLN: INTRAVENOUS | Status: DC

## 2014-06-30 MED ORDER — SODIUM CHLORIDE 0.9 % IV BOLUS (SEPSIS)
1000.0000 mL | Freq: Once | INTRAVENOUS | Status: AC
Start: 2014-06-30 — End: 2014-06-30
  Administered 2014-06-30: 1000 mL via INTRAVENOUS

## 2014-06-30 MED ORDER — MORPHINE SULFATE 2 MG/ML IJ SOLN
INTRAMUSCULAR | Status: AC
  Administered 2014-06-30: 1 mg via INTRAVENOUS
  Filled 2014-06-30: qty 1

## 2014-06-30 MED ORDER — KETOROLAC TROMETHAMINE 60 MG/2ML IM SOLN
60.0000 mg | Freq: Once | INTRAMUSCULAR | Status: AC
  Administered 2014-06-30: 60 mg via INTRAMUSCULAR
  Filled 2014-06-30: qty 2

## 2014-06-30 MED ORDER — ONDANSETRON HCL 4 MG PO TABS: 4.0000 mg | ORAL_TABLET | Freq: Four times a day (QID) | ORAL | Status: DC | PRN

## 2014-06-30 MED ORDER — ACETAMINOPHEN 325 MG PO TABS
650.0000 mg | ORAL_TABLET | Freq: Four times a day (QID) | ORAL | Status: DC | PRN
  Administered 2014-07-01: 650 mg via ORAL
  Filled 2014-06-30 (×2): qty 2

## 2014-06-30 MED ORDER — SODIUM CHLORIDE 0.9 % IV SOLN
INTRAVENOUS | Status: AC
  Administered 2014-06-30 – 2014-07-01 (×3): via INTRAVENOUS

## 2014-06-30 MED ORDER — CARBAMAZEPINE 200 MG PO TABS
400.0000 mg | ORAL_TABLET | Freq: Two times a day (BID) | ORAL | Status: DC
  Filled 2014-06-30 (×3): qty 2

## 2014-06-30 MED ORDER — SODIUM CHLORIDE 0.9 % IV SOLN
3.0000 g | Freq: Three times a day (TID) | INTRAVENOUS | Status: DC
  Administered 2014-06-30 – 2014-07-01 (×2): 3 g via INTRAVENOUS
  Filled 2014-06-30 (×4): qty 3

## 2014-06-30 MED ORDER — ACETAMINOPHEN 325 MG PO TABS
650.0000 mg | ORAL_TABLET | Freq: Once | ORAL | Status: AC
  Administered 2014-06-30: 650 mg via ORAL
  Filled 2014-06-30: qty 2

## 2014-06-30 MED ORDER — SODIUM CHLORIDE 0.9 % IV BOLUS (SEPSIS)
1000.0000 mL | Freq: Once | INTRAVENOUS | Status: AC
  Administered 2014-06-30: 1000 mL via INTRAVENOUS

## 2014-06-30 MED ORDER — HYDRALAZINE HCL 20 MG/ML IJ SOLN: 10.0000 mg | INTRAMUSCULAR | Status: DC | PRN

## 2014-06-30 MED ORDER — MORPHINE SULFATE 2 MG/ML IJ SOLN
1.0000 mg | INTRAMUSCULAR | Status: DC | PRN
Start: 2014-06-30 — End: 2014-07-02
  Administered 2014-07-01 (×2): 1 mg via INTRAVENOUS
  Filled 2014-06-30 (×2): qty 1

## 2014-06-30 MED ORDER — ACETAMINOPHEN 650 MG RE SUPP: 650.0000 mg | Freq: Four times a day (QID) | RECTAL | Status: DC | PRN

## 2014-06-30 MED ORDER — ONDANSETRON HCL 4 MG/2ML IJ SOLN
4.0000 mg | Freq: Four times a day (QID) | INTRAMUSCULAR | Status: DC | PRN
Start: 2014-06-30 — End: 2014-07-02
  Filled 2014-06-30: qty 2

## 2014-06-30 NOTE — Progress Notes (Signed)
Called Amy, RN at [facility] for report who states patient is not yet ready for transfer.

## 2014-06-30 NOTE — Progress Notes (Signed)
New Admission Note:   Arrival Method: [facility] stretcher [facility] Mental Orientation: Alert and Oriented X4 Telemetry: None per MD orders  Assessment: Completed Skin: Warm, dry and intact  IV: Clean, dry and intact. Normal saline running at 150 mL/hr per MD orders  Pain: 7 out of 10 sinus pressure pain. Pain medication given check eMAR  Tubes: None Safety Measures: Safety Fall Prevention Plan has been given, discussed and signed Admission: Completed [address] [place] Orientation: Patient has been orientated to the room, unit and staff.  Family: Husband at the bedside. He is not sure yet if he will be staying the night  Orders have been reviewed and implemented. Flu swap has been performed. SCDs ordered. Will continue to monitor the patient. Call light has been placed within reach and bed alarm has been activated.   PACCAR Incyanne Hill BSN, RN-BC, RN3 Phone number: [phone]

## 2014-06-30 NOTE — ED Notes (Signed)
Pt states cough causing back pain, Headache.  C/o dizziness and generalized weakness.  No N/V

## 2014-06-30 NOTE — ED Notes (Signed)
Pt care transferred to [facility] staff at bedside. 

## 2014-06-30 NOTE — ED Provider Notes (Signed)
CSN: [id number]     Arrival date & time 06/30/14  1057 History  This chart was scribed for Glynn OctaveStephen Jermie Hippe, MD by Leone PayorSonum Patel, ED Scribe. This patient was seen in room MH09/MH09 and the patient's care was started 12:02 PM.    Chief Complaint  Patient presents with  . Cough    Headache worsening, fever chills, pain in back with coughing    The history is provided by the patient. No language interpreter was used.     HPI Comments: Annette Doyle is a 49 y.o. female who presents to the Emergency Department complaining of a gradual onset, intermittent, gradually worsened cough that began last night. Patient reports associated mild sore throat, HA, chills, and generalized myalgias that also began yesterday. She complains of mid back pain since the onset of the cough. She reports mild SOB which is worse with coughing. Patient states she received the flu vaccine this year. She denies recent foreign travel. She denies CP, abdominal pain, nausea, vomiting, diarrhea.    Past Medical History  Diagnosis Date  . Seizures   . Panic attacks    Past Surgical History  Procedure Laterality Date  . Colon surgery    . Cesarean section     History reviewed. No pertinent family history. History  Substance Use Topics  . Smoking status: Never Smoker   . Smokeless tobacco: Not on file  . Alcohol Use: No   OB History    No data available     Review of Systems  A complete 10 system review of systems was obtained and all systems are negative except as noted in the HPI and PMH.    Allergies  Review of patient's allergies indicates no known allergies.  Home Medications   Prior to Admission medications   Medication Sig Start Date End Date Taking? Authorizing Provider  ALPRAZolam (XANAX PO) Take by mouth.    Historical Provider, MD  CarBAMazepine (TEGRETOL PO) Take by mouth.    Historical Provider, MD  ciprofloxacin (CIPRO) 500 MG tablet Take 1 tablet (500 mg total) by mouth every 12 (twelve) hours.  04/11/12   Elson AreasLeslie K Sofia, PA-C  HYDROcodone-acetaminophen (NORCO/VICODIN) 5-325 MG per tablet Take 2 tablets by mouth every 4 (four) hours as needed for pain. 04/11/12   Elson AreasLeslie K Sofia, PA-C  metroNIDAZOLE (FLAGYL) 500 MG tablet Take 1 tablet (500 mg total) by mouth 2 (two) times daily. 04/11/12   Elson AreasLeslie K Sofia, PA-C  ondansetron (ZOFRAN ODT) 4 MG disintegrating tablet Take 1 tablet (4 mg total) by mouth every 8 (eight) hours as needed for nausea. 04/11/12   Elson AreasLeslie K Sofia, PA-C   BP 98/55 mmHg  Pulse 97  Temp(Src) 100.2 F (37.9 C) (Oral)  Resp 18  Ht 5\' 4"  (1.626 m)  Wt 185 lb (83.915 kg)  BMI 31.74 kg/m2  SpO2 95%  LMP 06/16/2014 Physical Exam  Constitutional: She is oriented to person, place, and time. She appears well-developed and well-nourished. No distress.  HENT:  Head: Normocephalic and atraumatic.  Mouth/Throat: Oropharynx is clear and moist. No oropharyngeal exudate.  Eyes: Conjunctivae and EOM are normal. Pupils are equal, round, and reactive to light.  Neck: Normal range of motion. Neck supple.  No meningismus.  Cardiovascular: Normal rate, regular rhythm, normal heart sounds and intact distal pulses.   No murmur heard. Pulmonary/Chest: Effort normal and breath sounds normal. No respiratory distress.  Dry cough. Decreased breath sounds  Abdominal: Soft. There is no tenderness. There is no rebound and  no guarding.  Musculoskeletal: Normal range of motion. She exhibits tenderness. She exhibits no edema.  Paraspinal thoracic and lumbar tenderness.   Neurological: She is alert and oriented to person, place, and time. No cranial nerve deficit. She exhibits normal muscle tone. Coordination normal.  No ataxia on finger to nose bilaterally. No pronator drift. 5/5 strength throughout. CN 2-12 intact. Negative Romberg. Equal grip strength. Sensation intact. Gait is normal.   Skin: Skin is warm.  Psychiatric: She has a normal mood and affect. Her behavior is normal.  Nursing note and  vitals reviewed.   ED Course  Procedures (including critical care time)  DIAGNOSTIC STUDIES: Oxygen Saturation is 99% on RA, normal by my interpretation.    COORDINATION OF CARE: 12:06 PM Will order CXR and rapid strep screen. Discussed treatment plan with pt at bedside and pt agreed to plan.   Labs Review Labs Reviewed  CBC WITH DIFFERENTIAL/PLATELET - Abnormal; Notable for the following:    WBC 3.5 (*)    RBC 3.34 (*)    Hemoglobin 9.3 (*)    HCT 28.2 (*)    Platelets 93 (*)    Neutrophils Relative % 82 (*)    Lymphs Abs 0.4 (*)    All other components within normal limits  BASIC METABOLIC PANEL - Abnormal; Notable for the following:    Sodium 133 (*)    Glucose, Bld 107 (*)    Creatinine, Ser 1.77 (*)    GFR calc non Af Amer 33 (*)    GFR calc Af Amer 39 (*)    All other components within normal limits  DIC (DISSEMINATED INTRAVASCULAR COAGULATION) PANEL - Abnormal; Notable for the following:    D-Dimer, Quant 0.91 (*)    All other components within normal limits  RAPID STREP SCREEN  MONONUCLEOSIS SCREEN  INFLUENZA PANEL BY PCR (TYPE A & B, H1N1)    Imaging Review Dg Chest 2 View  06/30/2014   CLINICAL DATA:  Acute cough, fever, headache  EXAM: CHEST  2 VIEW  COMPARISON:  04/18/2012 [facility]  FINDINGS: The heart size and mediastinal contours are within normal limits. Both lungs are clear. The visualized skeletal structures are unremarkable.  IMPRESSION: No active cardiopulmonary disease.   Electronically Signed   By: Judie Petit.  Shick M.D.   On: 06/30/2014 12:54     EKG Interpretation None      MDM   Final diagnoses:  Influenza-like illness  Pancytopenia   Chills, cough, headache, fever, bodyaches since last night. Did get flu shot, no sick contacts.  Febrile, tachycardic, no meningismus, lung clear. Fluids, antipyretics, CXR uA.  Workup remarkable for new pancytopenia. Mono negative. Spherocytes. No schistocytes on DIC panel. Suspect due to illness  but concerning. AKI as well.  Continue IVF.  Observation dw Dr. Rito Ehrlich.   I personally performed the services described in this documentation, which was scribed in my presence. The recorded information has been reviewed and is accurate.   Glynn Octave, MD 06/30/14 [phone]

## 2014-06-30 NOTE — Progress Notes (Signed)
ANTIBIOTIC CONSULT NOTE - INITIAL  Pharmacy Consult for Unasyn Indication: sinusitis  No Known Allergies  Patient Measurements: Height: 5\' 4"  (162.6 cm) Weight: 185 lb (83.915 kg) IBW/kg (Calculated) : 54.7  Vital Signs: Temp: 101 F (38.3 C) (03/22 1906) Temp Source: Oral (03/22 1906) BP: 124/72 mmHg (03/22 1906) Pulse Rate: 97 (03/22 1906) Intake/Output from previous day:   Intake/Output from this shift:    Labs:  Recent Labs  06/30/14 1230 06/30/14 1445  WBC 3.5*  --   HGB 9.3*  --   PLT 93* 198  CREATININE 1.77*  --    Estimated Creatinine Clearance: 41.6 mL/min (by C-G formula based on Cr of 1.77). No results for input(s): VANCOTROUGH, VANCOPEAK, VANCORANDOM, GENTTROUGH, GENTPEAK, GENTRANDOM, TOBRATROUGH, TOBRAPEAK, TOBRARND, AMIKACINPEAK, AMIKACINTROU, AMIKACIN in the last 72 hours.   Microbiology: Recent Results (from the past 720 hour(s))  Rapid strep screen     Status: None   Collection Time: 06/30/14 12:20 PM  Result Value Ref Range Status   Streptococcus, Group A Screen (Direct) NEGATIVE NEGATIVE Final    Comment: (NOTE) A Rapid Antigen test may result negative if the antigen level in the sample is below the detection level of this test. The FDA has not cleared this test as a stand-alone test therefore the rapid antigen negative result has reflexed to a Group A Strep culture.     Medical History: Past Medical History  Diagnosis Date  . Seizures   . Panic attacks     Medications:  Await electronic med rec  Assessment: 49 y.o. female presents with cough, headache, fever. To begin Unasyn for sinusitis (after blood cultures drawn). Pt noted with pancytopenia. SCr up to 1.77 (baseline 1). Est CrCl 40 ml/min. Tm 102.6.   Goal of Therapy:  Resolution of infection  Plan:  1. Unasyn 3gm IV q8h (First dose after blood cultures drawn) 2. F/u renal function, pt's clinical condition, and micro data  Christoper Fabianaron Jeffry Vogelsang, PharmD, BCPS Clinical pharmacist,  pager [phone] 06/30/2014,9:14 PM

## 2014-06-30 NOTE — H&P (Signed)
[facility] History and Physical  Glyn Adeerri I Casserly ZOX:[id number] DOB: 08/03/1965 DOA: 06/30/2014  Referring physician: ER physician. Patient was transferred from [facility]. PCP: No primary care provider on file.   Chief Complaint: Fever and chills.  HPI: Glyn Adeerri I [place] is a 49 y.o. female with history of seizure disorder on carbamazepine for many years, hypertension presents to the ER with complaints of fever and chills over the last 2 days. Patient states that she has been having subjective feeling of fever chills and sweating. Denies any recent travel or insect bite or any sick contacts. Today patient also had some pain around her sinuses with a running nose. Denies any shortness of breath chest pain nausea vomiting abdominal pain diarrhea dysuria discharges. In the ER patient was found to be tachycardic and febrile. Patient has been admitted for further management. On exam patient is not in distress. Denies any neck pain or headache except for the pain around her sinuses.   Review of Systems: As presented in the history of presenting illness, rest negative.  Past Medical History  Diagnosis Date  . Seizures   . Panic attacks    Past Surgical History  Procedure Laterality Date  . Colon surgery    . Cesarean section     Social History:  reports that she has never smoked. She does not have any smokeless tobacco history on file. She reports that she does not drink alcohol or use illicit drugs. Where does patient live home. Can patient participate in ADLs? Yes.  No Known Allergies  Family History:  Family History  Problem Relation Age of Onset  . Diabetes Mellitus II Mother   . Hypertension Mother       Prior to Admission medications   Medication Sig Start Date End Date Taking? Authorizing Provider  ALPRAZolam (XANAX PO) Take by mouth.    Historical Provider, MD  CarBAMazepine (TEGRETOL PO) Take by mouth.    Historical Provider, MD  ciprofloxacin (CIPRO) 500 MG  tablet Take 1 tablet (500 mg total) by mouth every 12 (twelve) hours. 04/11/12   Elson AreasLeslie K Sofia, PA-C  HYDROcodone-acetaminophen (NORCO/VICODIN) 5-325 MG per tablet Take 2 tablets by mouth every 4 (four) hours as needed for pain. 04/11/12   Elson AreasLeslie K Sofia, PA-C  metroNIDAZOLE (FLAGYL) 500 MG tablet Take 1 tablet (500 mg total) by mouth 2 (two) times daily. 04/11/12   Elson AreasLeslie K Sofia, PA-C  ondansetron (ZOFRAN ODT) 4 MG disintegrating tablet Take 1 tablet (4 mg total) by mouth every 8 (eight) hours as needed for nausea. 04/11/12   Elson AreasLeslie K Sofia, PA-C    Physical Exam: Filed Vitals:   06/30/14 1352 06/30/14 1530 06/30/14 1710 06/30/14 1906  BP: 98/55 112/62 109/66 124/72  Pulse: 97 99 98 97  Temp: 100.2 F (37.9 C) 99.9 F (37.7 C) 100 F (37.8 C) 101 F (38.3 C)  TempSrc: Oral Oral Oral Oral  Resp: 18 18 20 17   Height:      Weight:      SpO2: 95% 97% 96% 100%     General:  Well-developed and nourished.  Eyes: Anicteric no pallor.  ENT: No tenderness around the sinuses.  Neck: No neck rigidity or mass felt.  Cardiovascular: S1 and S2 heard.  Respiratory: No rhonchi or crepitations.  Abdomen: Soft nontender bowel sounds present.  Skin: No rash.  Musculoskeletal: No edema.  Psychiatric: Appears normal.  Neurologic: Alert awake oriented to time place and person. Moves all extremities.  Labs on Admission:  Basic Metabolic Panel:  Recent Labs Lab 06/30/14 1230  NA 133*  K 4.3  CL 100  CO2 24  GLUCOSE 107*  BUN 21  CREATININE 1.77*  CALCIUM 8.9   Liver Function Tests: No results for input(s): AST, ALT, ALKPHOS, BILITOT, PROT, ALBUMIN in the last 168 hours. No results for input(s): LIPASE, AMYLASE in the last 168 hours. No results for input(s): AMMONIA in the last 168 hours. CBC:  Recent Labs Lab 06/30/14 1230 06/30/14 1445  WBC 3.5*  --   NEUTROABS 3.0  --   HGB 9.3*  --   HCT 28.2*  --   MCV 84.4  --   PLT 93* 198   Cardiac Enzymes: No results for  input(s): CKTOTAL, CKMB, CKMBINDEX, TROPONINI in the last 168 hours.  BNP (last 3 results) No results for input(s): BNP in the last 8760 hours.  ProBNP (last 3 results) No results for input(s): PROBNP in the last 8760 hours.  CBG: No results for input(s): GLUCAP in the last 168 hours.  Radiological Exams on Admission: Dg Chest 2 View  06/30/2014   CLINICAL DATA:  Acute cough, fever, headache  EXAM: CHEST  2 VIEW  COMPARISON:  04/18/2012 [facility]  FINDINGS: The heart size and mediastinal contours are within normal limits. Both lungs are clear. The visualized skeletal structures are unremarkable.  IMPRESSION: No active cardiopulmonary disease.   Electronically Signed   By: Judie Petit.  Shick M.D.   On: 06/30/2014 12:54     Assessment/Plan Principal Problem:   SIRS (systemic inflammatory response syndrome) Active Problems:   Influenza-like illness   ARF (acute renal failure)   Essential hypertension   Seizure   Pancytopenia   1. SIRS - source most likely upper respiratory tract. Patient feels sinus pressure. I have ordered a sinus x-ray. Check blood cultures UA urine cultures procalcitonin levels continue with hydration and check lactic acid levels. Since patient has significant sinus pressure will place patient on Unasyn empirically after blood cultures obtained. Check influenza PCR. 2. Acute renal failure - baseline creatinine not known. Patient probably has acute renal failure. Patient states she is usually on lisinopril. Hold lisinopril. Check urine analysis. Closely follow intake output. 3. Leukopenia and anemia - patient's initial lab shows thrombocytopenia but repeat one showed normal. We will recheck CBC. Check LDH. Check anemia panel and HIV status. Patient's baseline labs are not known at this time may need to check on with patient's primary care physician. 4. Seizure disorder - on carbamazepine. Patient states he takes 400 mg twice daily. 5. Hypertension - patient states she  takes lisinopril which will be on hold due to renal failure. I have placed patient on when necessary IV hydralazine for now.   DVT Prophylaxis SCDs.  Code Status: Full code.  Family Communication: Patient's husband at the bedside.  Disposition Plan: Admit to inpatient.    Delonna Ney N. [facility] Pager [phone].  If 7PM-7AM, please contact night-coverage [email or web address] Password [facility] 06/30/2014, 9:07 PM

## 2014-07-01 DIAGNOSIS — R569 Unspecified convulsions: Secondary | ICD-10-CM

## 2014-07-01 DIAGNOSIS — M7989 Other specified soft tissue disorders: Secondary | ICD-10-CM

## 2014-07-01 DIAGNOSIS — J111 Influenza due to unidentified influenza virus with other respiratory manifestations: Secondary | ICD-10-CM

## 2014-07-01 LAB — COMPREHENSIVE METABOLIC PANEL
ALT: 14 U/L (ref 0–35)
ANION GAP: 6 (ref 5–15)
AST: 17 U/L (ref 0–37)
Albumin: 3.1 g/dL — ABNORMAL LOW (ref 3.5–5.2)
Alkaline Phosphatase: 59 U/L (ref 39–117)
BUN: 15 mg/dL (ref 6–23)
CO2: 23 mmol/L (ref 19–32)
CREATININE: 1.63 mg/dL — AB (ref 0.50–1.10)
Calcium: 7.8 mg/dL — ABNORMAL LOW (ref 8.4–10.5)
Chloride: 108 mmol/L (ref 96–112)
GFR, EST AFRICAN AMERICAN: 43 mL/min — AB (ref >90)
GFR, EST NON AFRICAN AMERICAN: 37 mL/min — AB (ref >90)
Glucose, Bld: 97 mg/dL (ref 70–99)
Potassium: 4 mmol/L (ref 3.5–5.1)
Sodium: 137 mmol/L (ref 135–145)
Total Bilirubin: 0.4 mg/dL (ref 0.3–1.2)
Total Protein: 6 g/dL (ref 6.0–8.3)

## 2014-07-01 LAB — IRON AND TIBC
IRON: 28 ug/dL — AB (ref 42–145)
Saturation Ratios: 14 % — ABNORMAL LOW (ref 20–55)
TIBC: 201 ug/dL — ABNORMAL LOW (ref 250–470)
UIBC: 173 ug/dL (ref 125–400)

## 2014-07-01 LAB — HIV ANTIBODY (ROUTINE TESTING W REFLEX): HIV Screen 4th Generation wRfx: NONREACTIVE

## 2014-07-01 LAB — INFLUENZA PANEL BY PCR (TYPE A & B)
H1N1 flu by pcr: DETECTED — AB
Influenza A By PCR: POSITIVE — AB
Influenza B By PCR: NEGATIVE

## 2014-07-01 LAB — CBC WITH DIFFERENTIAL/PLATELET
Basophils Absolute: 0 10*3/uL (ref 0.0–0.1)
Basophils Relative: 0 % (ref 0–1)
EOS ABS: 0 10*3/uL (ref 0.0–0.7)
Eosinophils Relative: 1 % (ref 0–5)
HEMATOCRIT: 26.2 % — AB (ref 36.0–46.0)
Hemoglobin: 8.5 g/dL — ABNORMAL LOW (ref 12.0–15.0)
LYMPHS ABS: 0.7 10*3/uL (ref 0.7–4.0)
Lymphocytes Relative: 21 % (ref 12–46)
MCH: 27.2 pg (ref 26.0–34.0)
MCHC: 32.4 g/dL (ref 30.0–36.0)
MCV: 84 fL (ref 78.0–100.0)
MONOS PCT: 10 % (ref 3–12)
Monocytes Absolute: 0.3 10*3/uL (ref 0.1–1.0)
NEUTROS ABS: 2.4 10*3/uL (ref 1.7–7.7)
Neutrophils Relative %: 68 % (ref 43–77)
Platelets: 173 10*3/uL (ref 150–400)
RBC: 3.12 MIL/uL — ABNORMAL LOW (ref 3.87–5.11)
RDW: 13.5 % (ref 11.5–15.5)
WBC: 3.5 10*3/uL — ABNORMAL LOW (ref 4.0–10.5)

## 2014-07-01 LAB — URINALYSIS, ROUTINE W REFLEX MICROSCOPIC
Bilirubin Urine: NEGATIVE
Glucose, UA: NEGATIVE mg/dL
Ketones, ur: 15 mg/dL — AB
LEUKOCYTES UA: NEGATIVE
Nitrite: NEGATIVE
Protein, ur: NEGATIVE mg/dL
SPECIFIC GRAVITY, URINE: 1.011 (ref 1.005–1.030)
Urobilinogen, UA: 0.2 mg/dL (ref 0.0–1.0)
pH: 5.5 (ref 5.0–8.0)

## 2014-07-01 LAB — FOLATE: Folate: 10.9 ng/mL

## 2014-07-01 LAB — URINE MICROSCOPIC-ADD ON

## 2014-07-01 LAB — LACTIC ACID, PLASMA: LACTIC ACID, VENOUS: 0.7 mmol/L (ref 0.5–2.0)

## 2014-07-01 LAB — VITAMIN B12: VITAMIN B 12: 289 pg/mL (ref 211–911)

## 2014-07-01 LAB — FERRITIN: Ferritin: 216 ng/mL (ref 10–291)

## 2014-07-01 MED ORDER — SALINE SPRAY 0.65 % NA SOLN
1.0000 | NASAL | Status: DC | PRN
  Filled 2014-07-01: qty 44

## 2014-07-01 MED ORDER — CARBAMAZEPINE ER 400 MG PO TB12
400.0000 mg | ORAL_TABLET | Freq: Every day | ORAL | Status: DC
  Administered 2014-07-01: 400 mg via ORAL
  Filled 2014-07-01 (×3): qty 1

## 2014-07-01 MED ORDER — OSELTAMIVIR PHOSPHATE 30 MG PO CAPS
30.0000 mg | ORAL_CAPSULE | Freq: Two times a day (BID) | ORAL | Status: DC
  Administered 2014-07-01 – 2014-07-02 (×3): 30 mg via ORAL
  Filled 2014-07-01 (×4): qty 1

## 2014-07-01 MED ORDER — CARBAMAZEPINE ER 200 MG PO TB12
200.0000 mg | ORAL_TABLET | Freq: Every day | ORAL | Status: DC
  Administered 2014-07-01 – 2014-07-02 (×2): 200 mg via ORAL
  Filled 2014-07-01 (×2): qty 1

## 2014-07-01 MED ORDER — GUAIFENESIN ER 600 MG PO TB12
600.0000 mg | ORAL_TABLET | Freq: Two times a day (BID) | ORAL | Status: DC
  Administered 2014-07-01: 600 mg via ORAL
  Filled 2014-07-01 (×5): qty 1

## 2014-07-01 NOTE — Progress Notes (Signed)
Preliminary Results: Bilateral lower extremity venous duplex completed. No evidence for DVT, SVT, or a Baker's cyst. Brianna L Mazza,RVT

## 2014-07-01 NOTE — Progress Notes (Signed)
PROGRESS NOTE  Annette Doyle ZOX:[id number] DOB: 1965-06-29 DOA: 06/30/2014 PCP: No primary care provider on file.   Annette Doyle is a 49 y.o. female with history of seizure disorder on carbamazepine for many years, hypertension presents to the ER with complaints of fever and chills over the last 2 days. Patient states that she has been having subjective feeling of fever chills and sweating. Denies any recent travel or insect bite or any sick contacts. Today patient also had some pain around her sinuses with a running nose. Denies any shortness of breath chest pain nausea vomiting abdominal pain diarrhea dysuria discharges. In the ER patient was found to be tachycardic and febrile. Patient has been admitted for further management. On exam patient is not in distress. Denies any neck pain or headache except for the pain around her sinuses.    Assessment/Plan: SIRS - source most likely upper respiratory tract.  -sinus pressure: x ray negative.  -Check blood cultures UA urine cultures  -procalcitonin levels low -Check influenza PCR-started on tamiflu  Acute renal failure - baseline creatinine around 1. Patient probably has acute renal failure. Patient states she is usually on lisinopril. Hold lisinopril. Check urine analysis. Closely follow intake output.  Leukopenia and anemia - -HIV status.  -monitor  Seizure disorder - on carbamazepine. Patient states he takes 400 mg twice daily.  Hypertension - patient states she takes lisinopril which will be on hold due to renal failure. I have placed patient on when necessary IV hydralazine for now.  Code Status: full Family Communication: patient Disposition Plan:    Consultants:    Procedures:      HPI/Subjective: C/o sinus pressure, better with saline  Objective: Filed Vitals:   07/01/14 0855  BP:   Pulse:   Temp: 99.9 F (37.7 C)  Resp:     Intake/Output Summary (Last 24 hours) at 07/01/14 1004 Last data filed at  07/01/14 0854  Gross per 24 hour  Intake 1682.5 ml  Output   1850 ml  Net -167.5 ml   Filed Weights   06/30/14 1111  Weight: 83.915 kg (185 lb)    Exam:   General:  A+Ox3, NAD  Cardiovascular: rrr  Respiratory: no whezing  Abdomen: +BS, soft  Musculoskeletal: no edema  Data Reviewed: Basic Metabolic Panel:  Recent Labs Lab 06/30/14 1230 07/01/14 0510  NA 133* 137  K 4.3 4.0  CL 100 108  CO2 24 23  GLUCOSE 107* 97  BUN 21 15  CREATININE 1.77* 1.63*  CALCIUM 8.9 7.8*   Liver Function Tests:  Recent Labs Lab 06/30/14 2203 07/01/14 0510  AST 16 17  ALT 13 14  ALKPHOS 57 59  BILITOT 0.4 0.4  PROT 6.2 6.0  ALBUMIN 3.2* 3.1*   No results for input(s): LIPASE, AMYLASE in the last 168 hours. No results for input(s): AMMONIA in the last 168 hours. CBC:  Recent Labs Lab 06/30/14 1230 06/30/14 1445 06/30/14 2203 07/01/14 0510  WBC 3.5*  --  3.5* 3.5*  NEUTROABS 3.0  --  2.6 2.4  HGB 9.3*  --  8.4* 8.5*  HCT 28.2*  --  26.0* 26.2*  MCV 84.4  --  84.4 84.0  PLT 93* 198 171 173   Cardiac Enzymes: No results for input(s): CKTOTAL, CKMB, CKMBINDEX, TROPONINI in the last 168 hours. BNP (last 3 results) No results for input(s): BNP in the last 8760 hours.  ProBNP (last 3 results) No results for input(s): PROBNP in the last 8760 hours.  CBG: No results for input(s): GLUCAP in the last 168 hours.  Recent Results (from the past 240 hour(s))  Rapid strep screen     Status: None   Collection Time: 06/30/14 12:20 PM  Result Value Ref Range Status   Streptococcus, Group A Screen (Direct) NEGATIVE NEGATIVE Final    Comment: (NOTE) A Rapid Antigen test may result negative if the antigen level in the sample is below the detection level of this test. The FDA has not cleared this test as a stand-alone test therefore the rapid antigen negative result has reflexed to a Group A Strep culture.      Studies: Dg Sinus 1-2 Views  07/01/2014   CLINICAL DATA:   Left-sided sinus pain for 2 days.  EXAM: PARANASAL SINUSES - 1-2 VIEW  COMPARISON:  None.  FINDINGS: The paranasal sinus are well aerated. There is no evidence of sinus opacification, air-fluid levels or mucosal thickening. No significant bone abnormalities are seen.  IMPRESSION: Negative.   Electronically Signed   By: Rubye OaksMelanie  Ehinger M.D.   On: 07/01/2014 02:15   Dg Chest 2 View  06/30/2014   CLINICAL DATA:  Acute cough, fever, headache  EXAM: CHEST  2 VIEW  COMPARISON:  04/18/2012 [facility]  FINDINGS: The heart size and mediastinal contours are within normal limits. Both lungs are clear. The visualized skeletal structures are unremarkable.  IMPRESSION: No active cardiopulmonary disease.   Electronically Signed   By: Judie PetitM.  Shick M.D.   On: 06/30/2014 12:54    Scheduled Meds: . ampicillin-sulbactam (UNASYN) IV  3 g Intravenous Q8H  . carbamazepine  400 mg Oral BID  . guaiFENesin  600 mg Oral BID  . oseltamivir  30 mg Oral BID   Continuous Infusions: . sodium chloride 150 mL/hr at 07/01/14 0220   Antibiotics Given (last 72 hours)    Date/Time Action Medication Dose Rate   06/30/14 2307 Given   Ampicillin-Sulbactam (UNASYN) 3 g in sodium chloride 0.9 % 100 mL IVPB 3 g 100 mL/hr   07/01/14 0501 Given   Ampicillin-Sulbactam (UNASYN) 3 g in sodium chloride 0.9 % 100 mL IVPB 3 g 100 mL/hr      Principal Problem:   SIRS (systemic inflammatory response syndrome) Active Problems:   Influenza-like illness   ARF (acute renal failure)   Essential hypertension   Seizure   Pancytopenia    Time spent: 25 min    VANN, JESSICA  [facility] Pager [phone] If 7PM-7AM, please contact night-coverage at [email or web address], password [email or web address] 07/01/2014, 10:04 AM  LOS: 1 day

## 2014-07-01 NOTE — Progress Notes (Signed)
Offered bath, Pt stated she will do bath once husband brings her personal items from home. Tech gave Pt bath supplies.

## 2014-07-02 LAB — CBC WITH DIFFERENTIAL/PLATELET
BASOS PCT: 0 % (ref 0–1)
Basophils Absolute: 0 10*3/uL (ref 0.0–0.1)
EOS ABS: 0.1 10*3/uL (ref 0.0–0.7)
Eosinophils Relative: 3 % (ref 0–5)
HCT: 30.3 % — ABNORMAL LOW (ref 36.0–46.0)
HEMOGLOBIN: 9.6 g/dL — AB (ref 12.0–15.0)
Lymphocytes Relative: 44 % (ref 12–46)
Lymphs Abs: 1.2 10*3/uL (ref 0.7–4.0)
MCH: 26.8 pg (ref 26.0–34.0)
MCHC: 31.7 g/dL (ref 30.0–36.0)
MCV: 84.6 fL (ref 78.0–100.0)
MONO ABS: 0.3 10*3/uL (ref 0.1–1.0)
MONOS PCT: 10 % (ref 3–12)
Neutro Abs: 1.2 10*3/uL — ABNORMAL LOW (ref 1.7–7.7)
Neutrophils Relative %: 43 % (ref 43–77)
Platelets: 186 10*3/uL (ref 150–400)
RBC: 3.58 MIL/uL — AB (ref 3.87–5.11)
RDW: 13.5 % (ref 11.5–15.5)
WBC: 2.7 10*3/uL — ABNORMAL LOW (ref 4.0–10.5)

## 2014-07-02 LAB — URINE CULTURE
COLONY COUNT: NO GROWTH
CULTURE: NO GROWTH

## 2014-07-02 LAB — BASIC METABOLIC PANEL
Anion gap: 8 (ref 5–15)
BUN: 10 mg/dL (ref 6–23)
CHLORIDE: 105 mmol/L (ref 96–112)
CO2: 23 mmol/L (ref 19–32)
Calcium: 8.5 mg/dL (ref 8.4–10.5)
Creatinine, Ser: 1.24 mg/dL — ABNORMAL HIGH (ref 0.50–1.10)
GFR calc Af Amer: 59 mL/min — ABNORMAL LOW (ref >90)
GFR, EST NON AFRICAN AMERICAN: 51 mL/min — AB (ref >90)
GLUCOSE: 96 mg/dL (ref 70–99)
POTASSIUM: 3.9 mmol/L (ref 3.5–5.1)
SODIUM: 136 mmol/L (ref 135–145)

## 2014-07-02 LAB — PROCALCITONIN: Procalcitonin: <0.1 ng/mL

## 2014-07-02 MED ORDER — OSELTAMIVIR PHOSPHATE 30 MG PO CAPS: 30.0000 mg | ORAL_CAPSULE | Freq: Two times a day (BID) | ORAL | Status: DC

## 2014-07-02 MED ORDER — OSELTAMIVIR PHOSPHATE 30 MG PO CAPS: 30.0000 mg | ORAL_CAPSULE | Freq: Two times a day (BID) | ORAL | Status: AC

## 2014-07-02 MED ORDER — LISINOPRIL-HYDROCHLOROTHIAZIDE 20-25 MG PO TABS: 1.0000 | ORAL_TABLET | Freq: Every day | ORAL | Status: AC

## 2014-07-02 MED ORDER — ACETAMINOPHEN 325 MG PO TABS: 650.0000 mg | ORAL_TABLET | Freq: Four times a day (QID) | ORAL | Status: AC | PRN

## 2014-07-02 NOTE — Progress Notes (Signed)
Patient Discharge: Disposition: Patient discharged to home. Education: Patient educated about medication, prescriptions, discharge instructions, and also easy to read handout on influenza, understood and acknowledged. IV: Discontinued before discharge. Transportation: Transported in w/c with staff and husband accompanying. Belongings: Patient took all her belongings with her.

## 2014-07-02 NOTE — Progress Notes (Deleted)
Physician Discharge Summary  Annette Doyle:[id number] DOB: Aug 10, 1965 DOA: 06/30/2014  PCP: Karle Plumber, MD  Admit date: 06/30/2014 Discharge date: 07/02/2014  Time spent: greater than 30 minutes  Recommendations for Outpatient Follow-up:  Monitor CBC, BMET  Discharge Diagnoses:  Principal Problem:   SIRS (systemic inflammatory response syndrome) Active Problems:   Influenza A   ARF (acute renal failure)   Essential hypertension   Seizure   Pancytopenia  Discharge Condition: stable  Diet recommendation: heart healthy  Filed Weights   06/30/14 1111 07/01/14 2001 07/02/14 0451  Weight: 83.915 kg (185 lb) 84.2 kg (185 lb 10 oz) 84.2 kg (185 lb 10 oz)    History of present illness:  49 y.o. female with history of seizure disorder on carbamazepine for many years, hypertension presents to the ER with complaints of fever and chills over the last 2 days. Patient states that she has been having subjective feeling of fever chills and sweating. Denies any recent travel or insect bite or any sick contacts. Today patient also had some pain around her sinuses with a running nose. Denies any shortness of breath chest pain nausea vomiting abdominal pain diarrhea dysuria discharges. In the ER patient was found to be tachycardic and febrile. Patient has been admitted for further management. On exam patient is not in distress. Denies any neck pain or headache except for the pain around her sinuses.   Hospital Course:  SIRS secondary to influenza a. Workup significant for positive influenza A PCR H1N1. Started on tamiflu. Hydrated.  Acute renal failure - baseline creatinine around 1. On admission 1.77. lisinipril held. Hydrated. By discharge, down to 1.2  pancytopenia -HIV negative Likely flu related. By discharge thrombocytopenia resolved. Suspect will resolve once over acute illness  Seizure disorder - on carbamazepine. Patient states he takes 400 mg twice daily.  Hypertension -  lisinipril held on admission.  May resume in a week  Procedures:  none  Consultations:  none  Discharge Exam: Filed Vitals:   07/02/14 1009  BP: 108/81  Pulse: 89  Temp: 98.5 F (36.9 C)  Resp: 20    General: a and o Cardiovascular:  RRR Respiratory: CTA  Discharge Instructions   Discharge Instructions    Diet - low sodium heart healthy    Complete by:  As directed      Increase activity slowly    Complete by:  As directed           Current Discharge Medication List    START taking these medications   Details  acetaminophen (TYLENOL) 325 MG tablet Take 2 tablets (650 mg total) by mouth every 6 (six) hours as needed for mild pain (or Fever >/= 101).    oseltamivir (TAMIFLU) 30 MG capsule Take 1 capsule (30 mg total) by mouth 2 (two) times daily. Qty: 7 capsule, Refills: 0      CONTINUE these medications which have CHANGED   Details  lisinopril-hydrochlorothiazide (PRINZIDE,ZESTORETIC) 20-25 MG per tablet Take 1 tablet by mouth daily. HOLD FOR 1 WEEK      CONTINUE these medications which have NOT CHANGED   Details  ALPRAZolam (XANAX) 0.5 MG tablet Take 0.5 mg by mouth daily as needed for anxiety.    carbamazepine (TEGRETOL XR) 200 MG 12 hr tablet Take 200-400 mg by mouth 2 (two) times daily. pt. takes 200 mg in AM, 400 mg in PM    omeprazole (PRILOSEC) 20 MG capsule Take 20 mg by mouth daily as needed (indegistion).  STOP taking these medications     ciprofloxacin (CIPRO) 500 MG tablet      HYDROcodone-acetaminophen (NORCO/VICODIN) 5-325 MG per tablet      metroNIDAZOLE (FLAGYL) 500 MG tablet      ondansetron (ZOFRAN ODT) 4 MG disintegrating tablet        Allergies  Allergen Reactions  . Tegretol [Carbamazepine] Other (See Comments)    Headaches !!!CAN TAKE BRAND NAME ONLY!!!      The results of significant diagnostics from this hospitalization (including imaging, microbiology, ancillary and laboratory) are listed below for reference.     Significant Diagnostic Studies: Dg Sinus 1-2 Views  07/01/2014   CLINICAL DATA:  Left-sided sinus pain for 2 days.  EXAM: PARANASAL SINUSES - 1-2 VIEW  COMPARISON:  None.  FINDINGS: The paranasal sinus are well aerated. There is no evidence of sinus opacification, air-fluid levels or mucosal thickening. No significant bone abnormalities are seen.  IMPRESSION: Negative.   Electronically Signed   By: Rubye Oaks M.D.   On: 07/01/2014 02:15   Dg Chest 2 View  06/30/2014   CLINICAL DATA:  Acute cough, fever, headache  EXAM: CHEST  2 VIEW  COMPARISON:  04/18/2012 [facility]  FINDINGS: The heart size and mediastinal contours are within normal limits. Both lungs are clear. The visualized skeletal structures are unremarkable.  IMPRESSION: No active cardiopulmonary disease.   Electronically Signed   By: Judie Petit.  Shick M.D.   On: 06/30/2014 12:54    Microbiology: Recent Results (from the past 240 hour(s))  Rapid strep screen     Status: None   Collection Time: 06/30/14 12:20 PM  Result Value Ref Range Status   Streptococcus, Group A Screen (Direct) NEGATIVE NEGATIVE Final    Comment: (NOTE) A Rapid Antigen test may result negative if the antigen level in the sample is below the detection level of this test. The FDA has not cleared this test as a stand-alone test therefore the rapid antigen negative result has reflexed to a Group A Strep culture.   Culture, blood (routine x 2)     Status: None (Preliminary result)   Collection Time: 06/30/14  9:56 PM  Result Value Ref Range Status   Specimen Description BLOOD LEFT ARM  Final   Special Requests BOTTLES DRAWN AEROBIC ONLY 6CC  Final   Culture   Final           BLOOD CULTURE RECEIVED NO GROWTH TO DATE CULTURE WILL BE HELD FOR 5 DAYS BEFORE ISSUING A FINAL NEGATIVE REPORT Performed at [facility]    Report Status PENDING  Incomplete  Culture, blood (routine x 2)     Status: None (Preliminary result)   Collection Time: 06/30/14  10:03 PM  Result Value Ref Range Status   Specimen Description BLOOD LEFT FOREARM  Final   Special Requests BOTTLES DRAWN AEROBIC AND ANAEROBIC 10CC EA  Final   Culture   Final           BLOOD CULTURE RECEIVED NO GROWTH TO DATE CULTURE WILL BE HELD FOR 5 DAYS BEFORE ISSUING A FINAL NEGATIVE REPORT Note: Culture results may be compromised due to an excessive volume of blood received in culture bottles. Performed at [facility]    Report Status PENDING  Incomplete     Labs: Basic Metabolic Panel:  Recent Labs Lab 06/30/14 1230 07/01/14 0510 07/02/14 0600  NA 133* 137 136  K 4.3 4.0 3.9  CL 100 108 105  CO2 24 23 23  GLUCOSE 107* 97 96  BUN 21 15 10   CREATININE 1.77* 1.63* 1.24*  CALCIUM 8.9 7.8* 8.5   Liver Function Tests:  Recent Labs Lab 06/30/14 2203 07/01/14 0510  AST 16 17  ALT 13 14  ALKPHOS 57 59  BILITOT 0.4 0.4  PROT 6.2 6.0  ALBUMIN 3.2* 3.1*   No results for input(s): LIPASE, AMYLASE in the last 168 hours. No results for input(s): AMMONIA in the last 168 hours. CBC:  Recent Labs Lab 06/30/14 1230 06/30/14 1445 06/30/14 2203 07/01/14 0510 07/02/14 0600  WBC 3.5*  --  3.5* 3.5* 2.7*  NEUTROABS 3.0  --  2.6 2.4 1.2*  HGB 9.3*  --  8.4* 8.5* 9.6*  HCT 28.2*  --  26.0* 26.2* 30.3*  MCV 84.4  --  84.4 84.0 84.6  PLT 93* 198 171 173 186   Cardiac Enzymes: No results for input(s): CKTOTAL, CKMB, CKMBINDEX, TROPONINI in the last 168 hours. BNP: BNP (last 3 results) No results for input(s): BNP in the last 8760 hours.  ProBNP (last 3 results) No results for input(s): PROBNP in the last 8760 hours.  CBG: No results for input(s): GLUCAP in the last 168 hours.     SignedChristiane Ha:  Shanetta Nicolls L  [facility] 07/02/2014, 11:14 AM

## 2014-07-07 LAB — CULTURE, BLOOD (ROUTINE X 2)
Culture: NO GROWTH
Culture: NO GROWTH

## 2014-07-07 NOTE — Discharge Summary (Signed)
Annette Ha, MD Physician Signed Internal Medicine Progress Notes 07/02/2014 11:14 AM    Expand All Collapse All   Physician Discharge Summary  Annette Doyle NWG:[id number] DOB: 11-13-1965 DOA: 06/30/2014  PCP: Annette Plumber, MD  Admit date: 06/30/2014 Discharge date: 07/02/2014  Time spent: greater than 30 minutes  Recommendations for Outpatient Follow-up:  Monitor CBC, BMET  Discharge Diagnoses:  Principal Problem:  SIRS (systemic inflammatory response syndrome) Active Problems:  Influenza A  ARF (acute renal failure)  Essential hypertension  Seizure  Pancytopenia  Discharge Condition: stable  Diet recommendation: heart healthy  Filed Weights   06/30/14 1111 07/01/14 2001 07/02/14 0451  Weight: 83.915 kg (185 lb) 84.2 kg (185 lb 10 oz) 84.2 kg (185 lb 10 oz)    History of present illness:  49 y.o. female with history of seizure disorder on carbamazepine for many years, hypertension presents to the ER with complaints of fever and chills over the last 2 days. Patient states that she has been having subjective feeling of fever chills and sweating. Denies any recent travel or insect bite or any sick contacts. Today patient also had some pain around her sinuses with a running nose. Denies any shortness of breath chest pain nausea vomiting abdominal pain diarrhea dysuria discharges. In the ER patient was found to be tachycardic and febrile. Patient has been admitted for further management. On exam patient is not in distress. Denies any neck pain or headache except for the pain around her sinuses.   Hospital Course:  SIRS secondary to influenza a. Workup significant for positive influenza A PCR H1N1. Started on tamiflu. Hydrated.  Acute renal failure - baseline creatinine around 1. On admission 1.77. lisinipril held. Hydrated. By discharge, down to 1.2  pancytopenia -HIV negative Likely flu related. By discharge thrombocytopenia resolved. Suspect will  resolve once over acute illness  Seizure disorder - on carbamazepine. Patient states he takes 400 mg twice daily.  Hypertension - lisinipril held on admission. May resume in a week  Procedures:  none  Consultations:  none  Discharge Exam: Filed Vitals:   07/02/14 1009  BP: 108/81  Pulse: 89  Temp: 98.5 F (36.9 C)  Resp: 20    General: a and o Cardiovascular: RRR Respiratory: CTA  Discharge Instructions   Discharge Instructions    Diet - low sodium heart healthy  Complete by: As directed      Increase activity slowly  Complete by: As directed           Current Discharge Medication List    START taking these medications   Details  acetaminophen (TYLENOL) 325 MG tablet Take 2 tablets (650 mg total) by mouth every 6 (six) hours as needed for mild pain (or Fever >/= 101).    oseltamivir (TAMIFLU) 30 MG capsule Take 1 capsule (30 mg total) by mouth 2 (two) times daily. Qty: 7 capsule, Refills: 0      CONTINUE these medications which have CHANGED   Details  lisinopril-hydrochlorothiazide (PRINZIDE,ZESTORETIC) 20-25 MG per tablet Take 1 tablet by mouth daily. HOLD FOR 1 WEEK      CONTINUE these medications which have NOT CHANGED   Details  ALPRAZolam (XANAX) 0.5 MG tablet Take 0.5 mg by mouth daily as needed for anxiety.    carbamazepine (TEGRETOL XR) 200 MG 12 hr tablet Take 200-400 mg by mouth 2 (two) times daily. pt. takes 200 mg in AM, 400 mg in PM    omeprazole (PRILOSEC) 20 MG capsule Take 20 mg by  mouth daily as needed (indegistion).      STOP taking these medications     ciprofloxacin (CIPRO) 500 MG tablet      HYDROcodone-acetaminophen (NORCO/VICODIN) 5-325 MG per tablet      metroNIDAZOLE (FLAGYL) 500 MG tablet      ondansetron (ZOFRAN ODT) 4 MG disintegrating tablet        Allergies  Allergen Reactions  . Tegretol [Carbamazepine] Other (See  Comments)    Headaches !!!CAN TAKE BRAND NAME ONLY!!!       The results of significant diagnostics from this hospitalization (including imaging, microbiology, ancillary and laboratory) are listed below for reference.    Significant Diagnostic Studies:  Imaging Results    Dg Sinus 1-2 Views  07/01/2014 CLINICAL DATA: Left-sided sinus pain for 2 days. EXAM: PARANASAL SINUSES - 1-2 VIEW COMPARISON: None. FINDINGS: The paranasal sinus are well aerated. There is no evidence of sinus opacification, air-fluid levels or mucosal thickening. No significant bone abnormalities are seen. IMPRESSION: Negative. Electronically Signed By: Rubye OaksMelanie Ehinger M.D. On: 07/01/2014 02:15   Dg Chest 2 View  06/30/2014 CLINICAL DATA: Acute cough, fever, headache EXAM: CHEST 2 VIEW COMPARISON: 04/18/2012 [facility] FINDINGS: The heart size and mediastinal contours are within normal limits. Both lungs are clear. The visualized skeletal structures are unremarkable. IMPRESSION: No active cardiopulmonary disease. Electronically Signed By: Judie PetitM. Shick M.D. On: 06/30/2014 12:54     Microbiology: Recent Results (from the past 240 hour(s))  Rapid strep screen Status: None   Collection Time: 06/30/14 12:20 PM  Result Value Ref Range Status   Streptococcus, Group A Screen (Direct) NEGATIVE NEGATIVE Final    Comment: (NOTE) A Rapid Antigen test may result negative if the antigen level in the sample is below the detection level of this test. The FDA has not cleared this test as a stand-alone test therefore the rapid antigen negative result has reflexed to a Group A Strep culture.   Culture, blood (routine x 2) Status: None (Preliminary result)   Collection Time: 06/30/14 9:56 PM  Result Value Ref Range Status   Specimen Description BLOOD LEFT ARM  Final   Special Requests BOTTLES DRAWN AEROBIC ONLY 6CC  Final   Culture   Final      BLOOD CULTURE RECEIVED NO GROWTH TO DATE CULTURE WILL BE HELD FOR 5 DAYS BEFORE ISSUING A FINAL NEGATIVE REPORT Performed at [facility]    Report Status PENDING  Incomplete  Culture, blood (routine x 2) Status: None (Preliminary result)   Collection Time: 06/30/14 10:03 PM  Result Value Ref Range Status   Specimen Description BLOOD LEFT FOREARM  Final   Special Requests BOTTLES DRAWN AEROBIC AND ANAEROBIC 10CC EA  Final   Culture   Final     BLOOD CULTURE RECEIVED NO GROWTH TO DATE CULTURE WILL BE HELD FOR 5 DAYS BEFORE ISSUING A FINAL NEGATIVE REPORT Note: Culture results may be compromised due to an excessive volume of blood received in culture bottles. Performed at [facility]    Report Status PENDING  Incomplete     Labs: Basic Metabolic Panel:  Last Labs      Recent Labs Lab 06/30/14 1230 07/01/14 0510 07/02/14 0600  NA 133* 137 136  K 4.3 4.0 3.9  CL 100 108 105  CO2 24 23 23   GLUCOSE 107* 97 96  BUN 21 15 10   CREATININE 1.77* 1.63* 1.24*  CALCIUM 8.9 7.8* 8.5     Liver Function Tests:  Last Labs  Recent Labs Lab 06/30/14 2203 07/01/14 0510  AST 16 17  ALT 13 14  ALKPHOS 57 59  BILITOT 0.4 0.4  PROT 6.2 6.0  ALBUMIN 3.2* 3.1*      Last Labs     No results for input(s): LIPASE, AMYLASE in the last 168 hours.    Last Labs     No results for input(s): AMMONIA in the last 168 hours.   CBC:  Last Labs      Recent Labs Lab 06/30/14 1230 06/30/14 1445 06/30/14 2203 07/01/14 0510 07/02/14 0600  WBC 3.5* --  3.5* 3.5* 2.7*  NEUTROABS 3.0 --  2.6 2.4 1.2*  HGB 9.3* --  8.4* 8.5* 9.6*  HCT 28.2* --  26.0* 26.2* 30.3*  MCV 84.4 --  84.4 84.0 84.6  PLT 93* 198 171 173 186     Cardiac Enzymes:  Last Labs     No results for input(s): CKTOTAL, CKMB, CKMBINDEX, TROPONINI  in the last 168 hours.   BNP: BNP (last 3 results)  Recent Labs (within last 365 days)    No results for input(s): BNP in the last 8760 hours.    ProBNP (last 3 results)  Recent Labs (within last 365 days)    No results for input(s): PROBNP in the last 8760 hours.    CBG:  Last Labs     No results for input(s): GLUCAP in the last 168 hours.       SignedChristiane Doyle [facility] 07/02/2014, 11:14 AM

## 2016-03-12 IMAGING — DX DG CHEST 2V
2 series · 2 of 2 positions shown · non-contrast
Comparison: 04/18/2012 [REDACTED]

CLINICAL DATA: Acute cough, fever, headache

EXAM:
CHEST  2 VIEW

[chest pa]
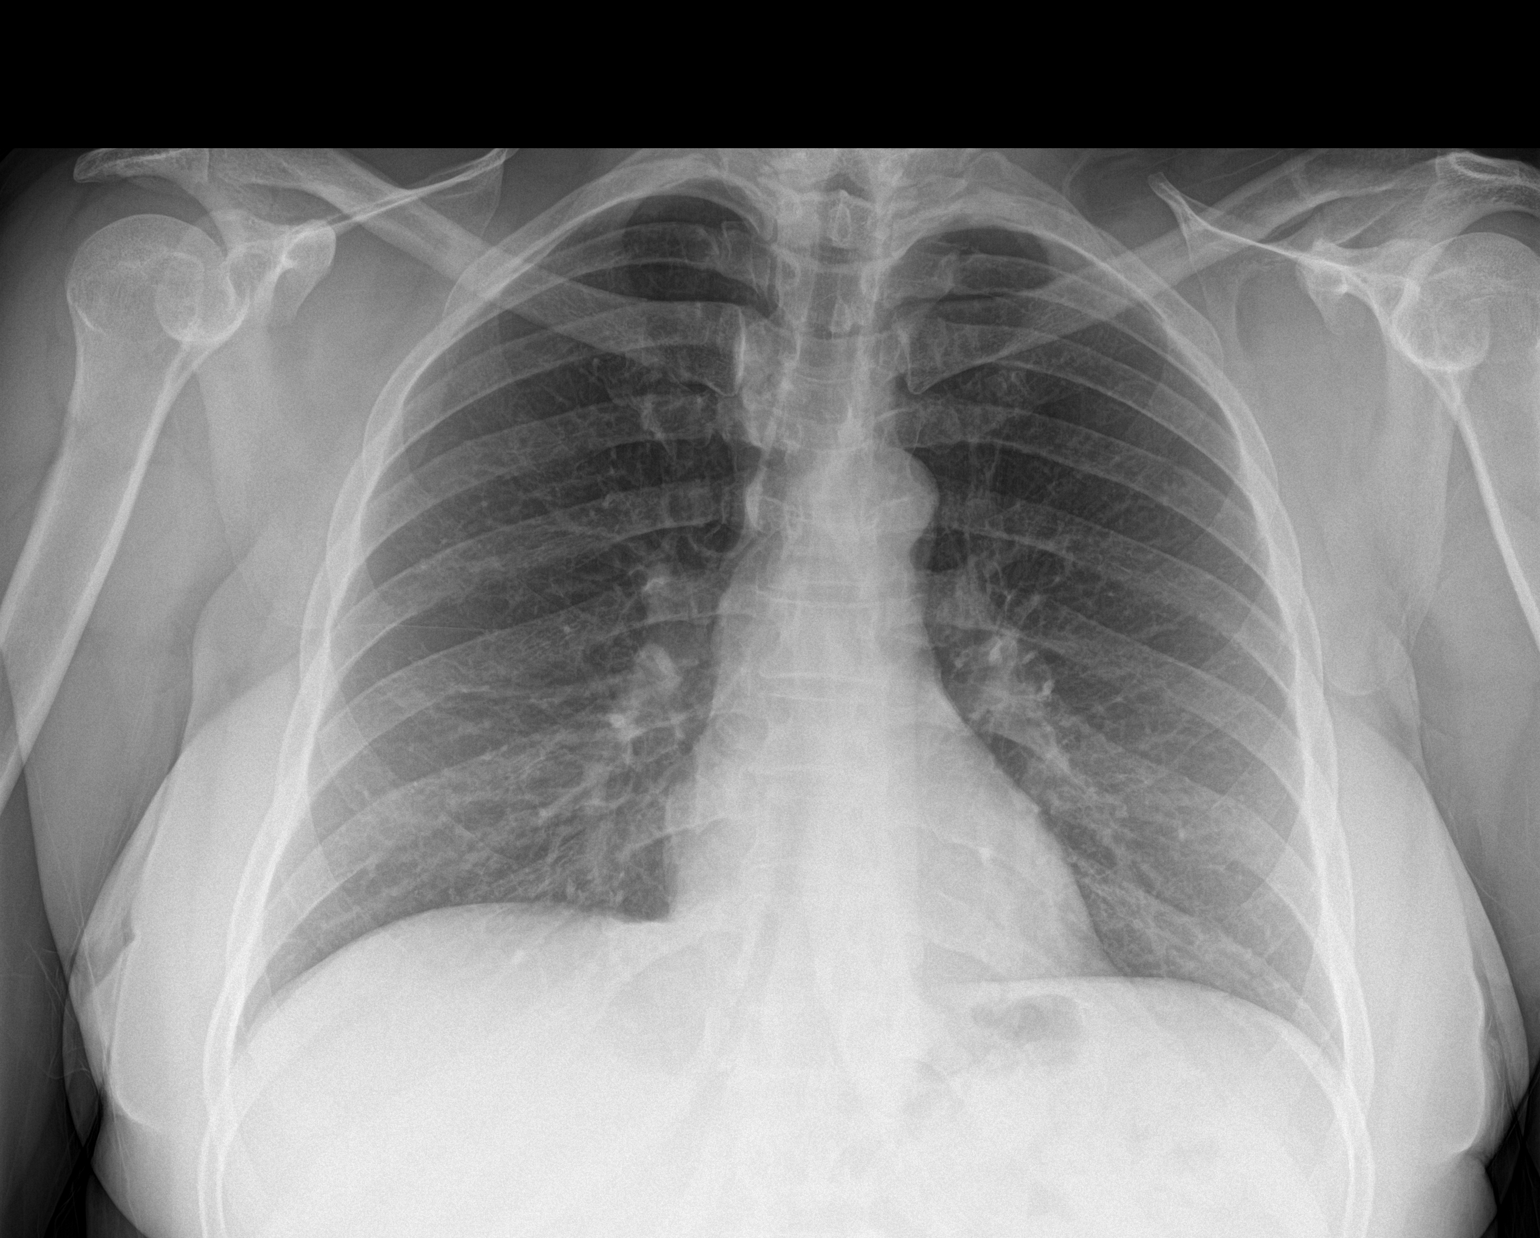

[chest lat]
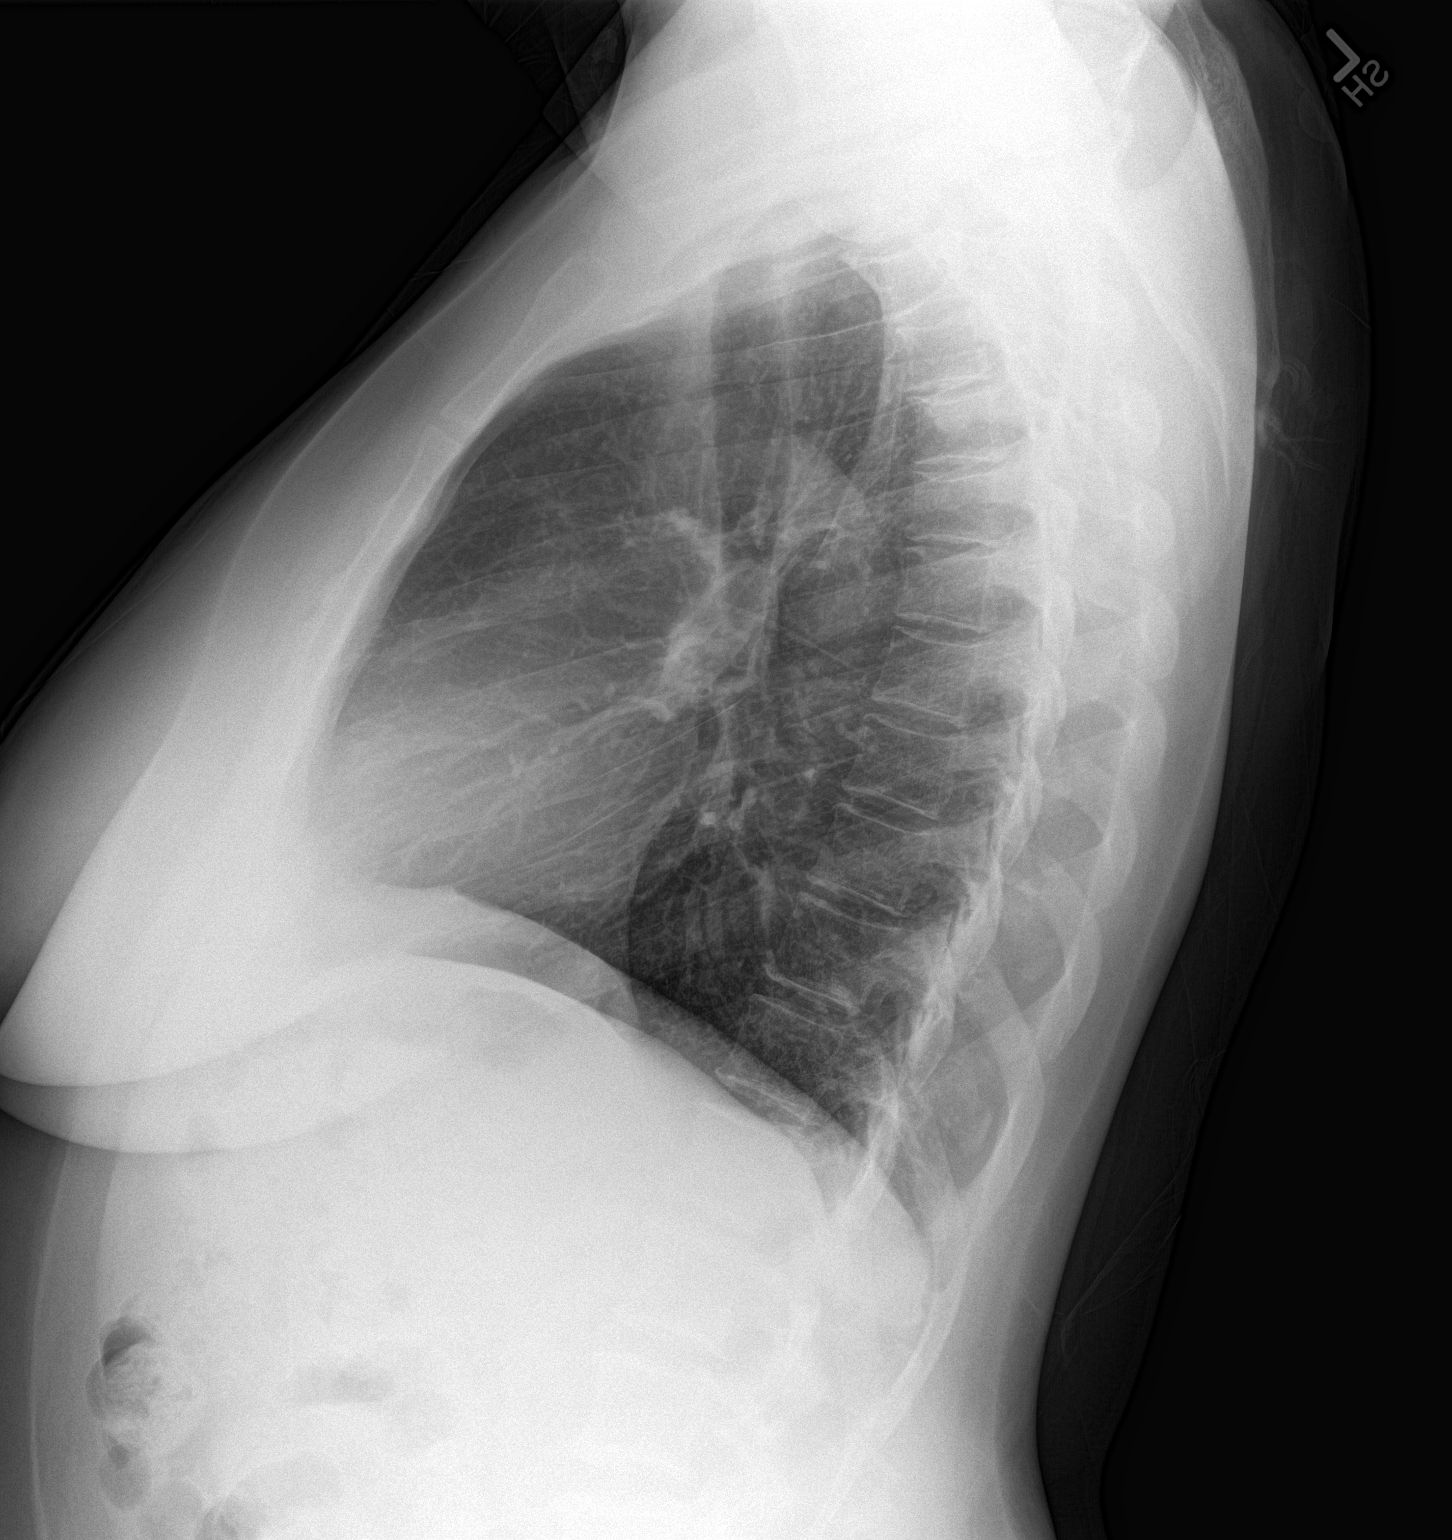

[2 of 2 positions shown; findings below may reference images not displayed]

FINDINGS: The heart size and mediastinal contours are within normal limits.
Both lungs are clear. The visualized skeletal structures are
unremarkable.
IMPRESSION: No active cardiopulmonary disease.

## 2016-03-12 IMAGING — DX DG SINUSES 1-2V
2 series · 2 of 2 positions shown · non-contrast
Comparison: None.

CLINICAL DATA: Left-sided sinus pain for 2 days.

EXAM:
PARANASAL SINUSES - 1-2 VIEW

[sinus waters]
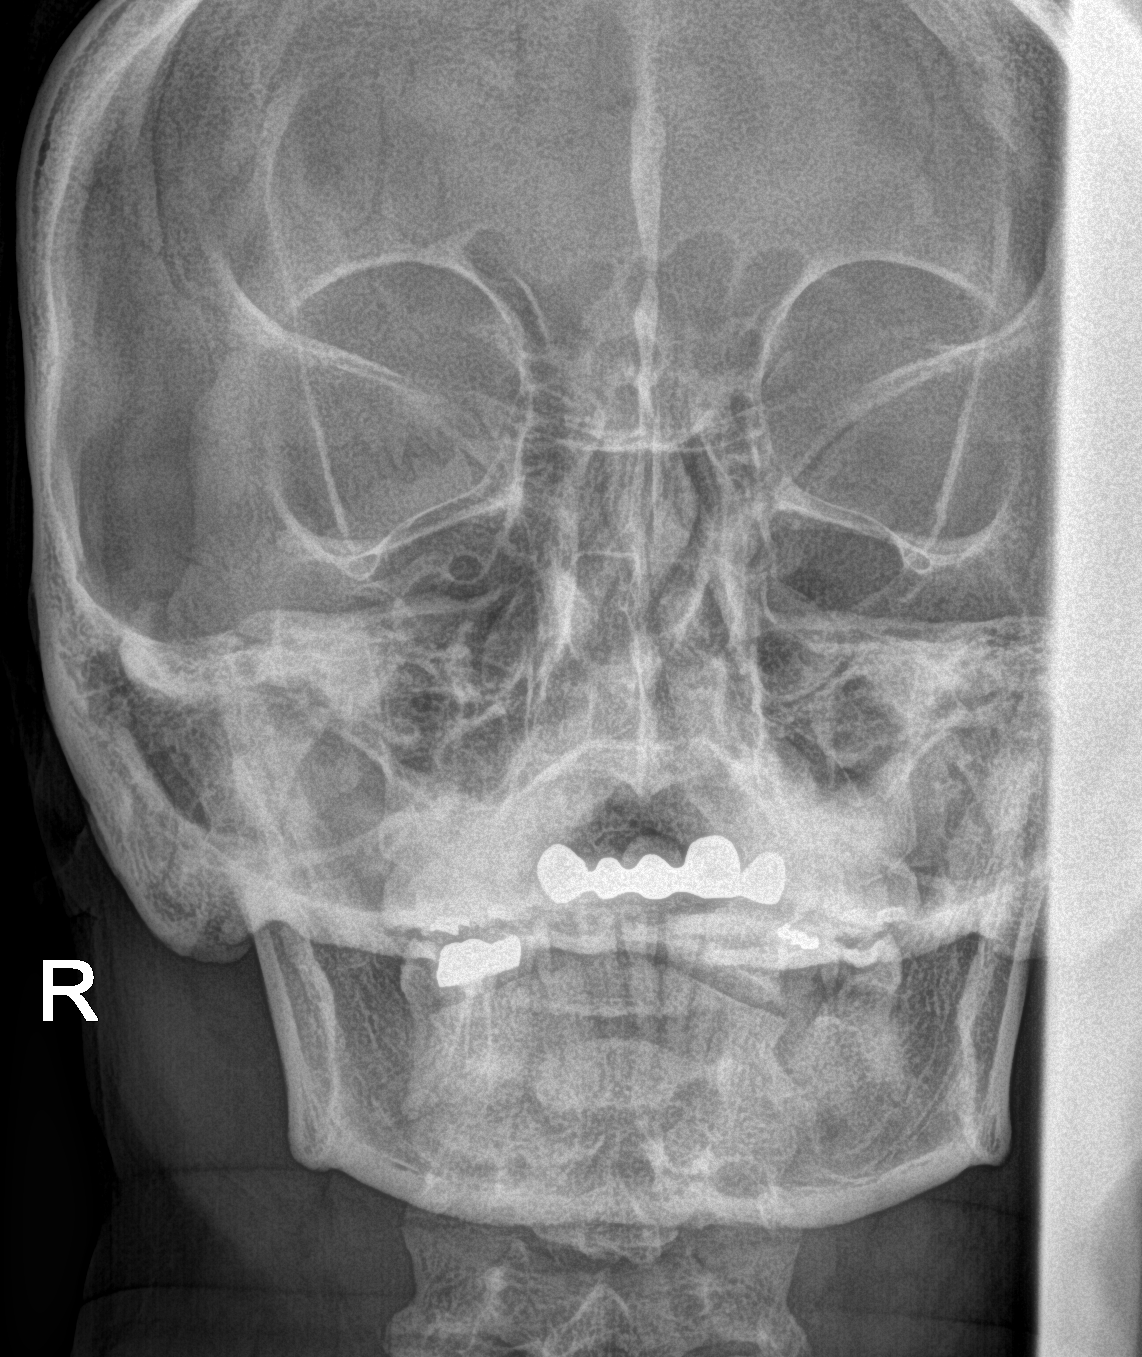

[sinus lat]
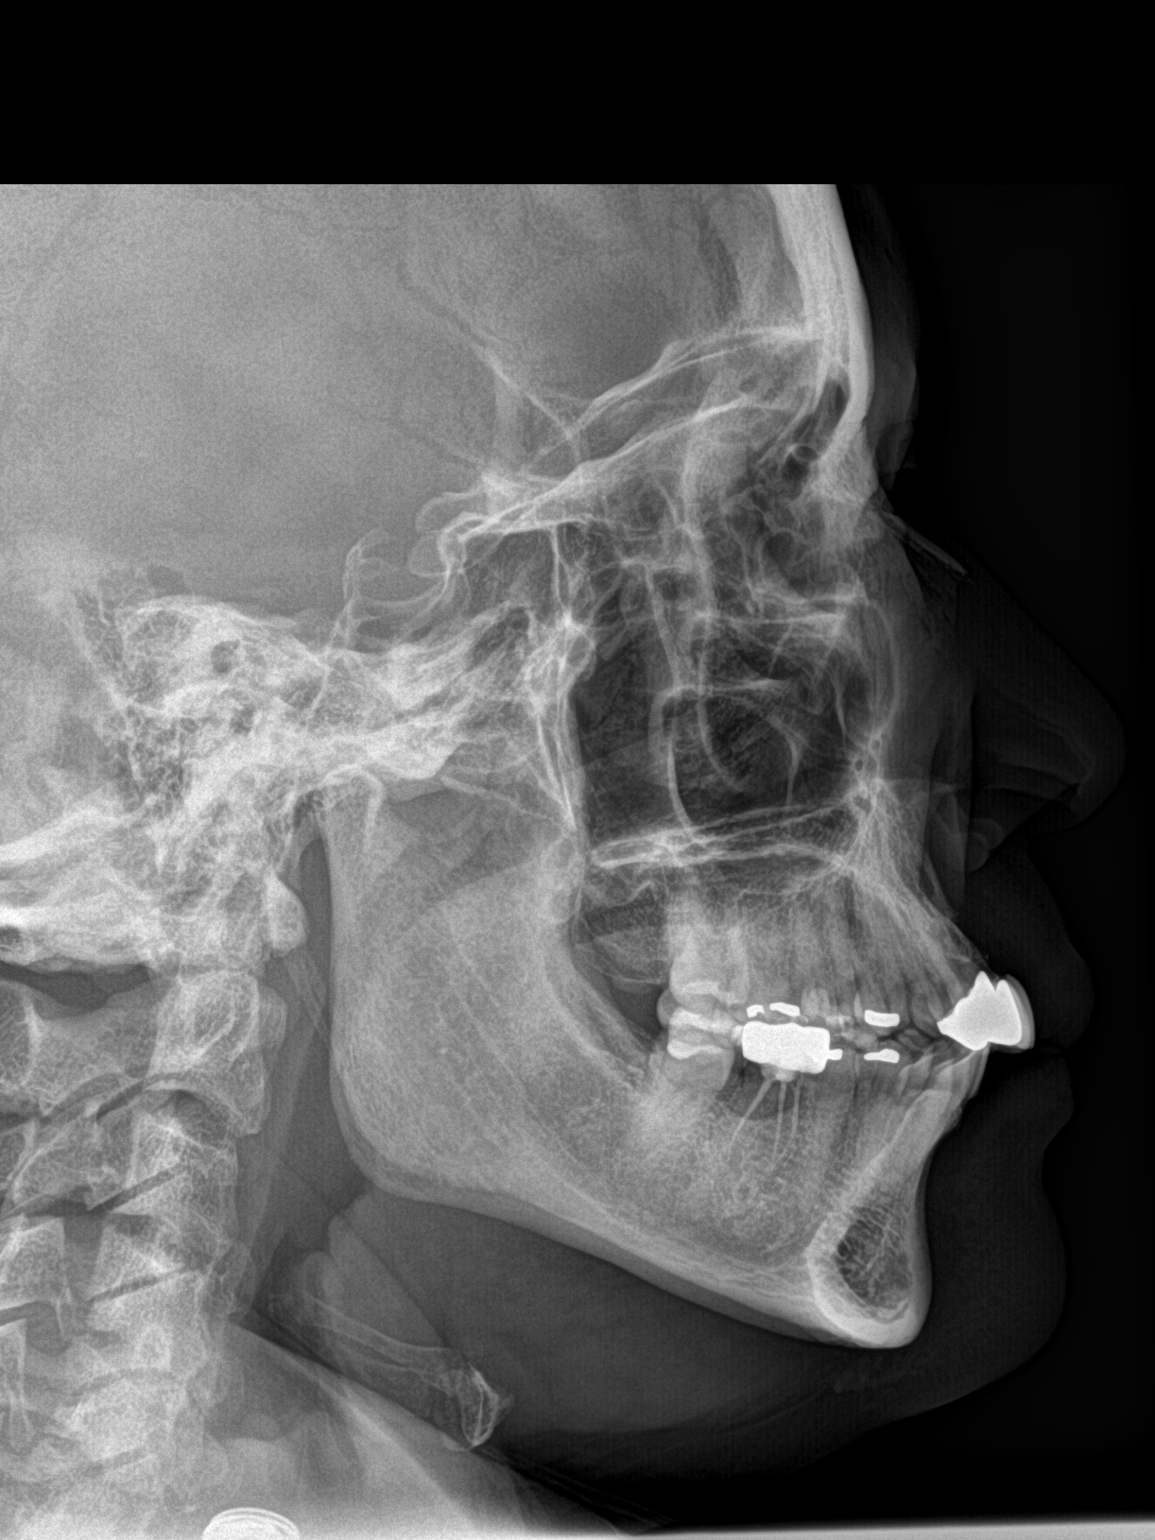

[2 of 2 positions shown; findings below may reference images not displayed]

FINDINGS: The paranasal sinus are well aerated. There is no evidence of sinus
opacification, air-fluid levels or mucosal thickening. No
significant bone abnormalities are seen.
IMPRESSION: Negative.

## 2020-05-30 ENCOUNTER — Emergency Department (HOSPITAL_BASED_OUTPATIENT_CLINIC_OR_DEPARTMENT_OTHER)
Admission: EM | Admit: 2020-05-30 | Discharge: 2020-05-30 | Disposition: A | Discharge status: Home / Self Care | Payer: Managed Care, Other (non HMO) | Attending: Emergency Medicine | Admitting: Emergency Medicine

## 2020-05-30 ENCOUNTER — Other Ambulatory Visit: Payer: Self-pay

## 2020-05-30 ENCOUNTER — Emergency Department (HOSPITAL_BASED_OUTPATIENT_CLINIC_OR_DEPARTMENT_OTHER): Payer: Managed Care, Other (non HMO)

## 2020-05-30 DIAGNOSIS — I10 Essential (primary) hypertension: Secondary | ICD-10-CM | POA: Diagnosis not present

## 2020-05-30 DIAGNOSIS — Z79899 Other long term (current) drug therapy: Secondary | ICD-10-CM | POA: Diagnosis not present

## 2020-05-30 DIAGNOSIS — J4551 Severe persistent asthma with (acute) exacerbation: Secondary | ICD-10-CM

## 2020-05-30 DIAGNOSIS — R0602 Shortness of breath: Secondary | ICD-10-CM | POA: Diagnosis present

## 2020-05-30 DIAGNOSIS — R000 Tachycardia, unspecified: Secondary | ICD-10-CM | POA: Diagnosis not present

## 2020-05-30 LAB — CBC WITH DIFFERENTIAL/PLATELET
Abs Immature Granulocytes: 0.03 10*3/uL (ref 0.00–0.07)
Basophils Absolute: 0.1 10*3/uL (ref 0.0–0.1)
Basophils Relative: 1 %
Eosinophils Absolute: 0.9 10*3/uL — ABNORMAL HIGH (ref 0.0–0.5)
Eosinophils Relative: 12 %
HCT: 37.2 % (ref 36.0–46.0)
Hemoglobin: 12 g/dL (ref 12.0–15.0)
Immature Granulocytes: 0 %
Lymphocytes Relative: 24 %
Lymphs Abs: 1.9 10*3/uL (ref 0.7–4.0)
MCH: 26.6 pg (ref 26.0–34.0)
MCHC: 32.3 g/dL (ref 30.0–36.0)
MCV: 82.5 fL (ref 80.0–100.0)
Monocytes Absolute: 0.5 10*3/uL (ref 0.1–1.0)
Monocytes Relative: 6 %
Neutro Abs: 4.4 10*3/uL (ref 1.7–7.7)
Neutrophils Relative %: 57 %
Platelets: 291 10*3/uL (ref 150–400)
RBC: 4.51 MIL/uL (ref 3.87–5.11)
RDW: 14.1 % (ref 11.5–15.5)
WBC: 7.7 10*3/uL (ref 4.0–10.5)
nRBC: 0 % (ref 0.0–0.2)

## 2020-05-30 LAB — BASIC METABOLIC PANEL
Anion gap: 11 (ref 5–15)
BUN: 19 mg/dL (ref 6–20)
CO2: 23 mmol/L (ref 22–32)
Calcium: 9.5 mg/dL (ref 8.9–10.3)
Chloride: 104 mmol/L (ref 98–111)
Creatinine, Ser: 1.23 mg/dL — ABNORMAL HIGH (ref 0.44–1.00)
GFR, Estimated: 53 mL/min — ABNORMAL LOW (ref >60)
Glucose, Bld: 88 mg/dL (ref 70–99)
Potassium: 3.9 mmol/L (ref 3.5–5.1)
Sodium: 138 mmol/L (ref 135–145)

## 2020-05-30 MED ORDER — IPRATROPIUM-ALBUTEROL 0.5-2.5 (3) MG/3ML IN SOLN: 3.0000 mL | Freq: Once | RESPIRATORY_TRACT | Status: AC

## 2020-05-30 MED ORDER — IPRATROPIUM-ALBUTEROL 0.5-2.5 (3) MG/3ML IN SOLN
RESPIRATORY_TRACT | Status: AC
  Administered 2020-05-30: 3 mL via RESPIRATORY_TRACT
  Filled 2020-05-30: qty 3

## 2020-05-30 MED ORDER — ALBUTEROL SULFATE (2.5 MG/3ML) 0.083% IN NEBU: 2.5000 mg | INHALATION_SOLUTION | Freq: Once | RESPIRATORY_TRACT | Status: AC

## 2020-05-30 MED ORDER — ALBUTEROL SULFATE (2.5 MG/3ML) 0.083% IN NEBU: 5.0000 mg | INHALATION_SOLUTION | Freq: Once | RESPIRATORY_TRACT | Status: DC

## 2020-05-30 MED ORDER — ALBUTEROL SULFATE (2.5 MG/3ML) 0.083% IN NEBU
INHALATION_SOLUTION | RESPIRATORY_TRACT | Status: AC
  Administered 2020-05-30: 2.5 mg via RESPIRATORY_TRACT
  Filled 2020-05-30: qty 3

## 2020-05-30 MED ORDER — MAGNESIUM SULFATE 2 GM/50ML IV SOLN
2.0000 g | Freq: Once | INTRAVENOUS | Status: AC
  Administered 2020-05-30: 2 g via INTRAVENOUS
  Filled 2020-05-30: qty 50

## 2020-05-30 MED ORDER — SODIUM CHLORIDE 0.9 % IV SOLN: INTRAVENOUS | Status: DC | PRN

## 2020-05-30 MED ORDER — METHYLPREDNISOLONE SODIUM SUCC 125 MG IJ SOLR
125.0000 mg | Freq: Once | INTRAMUSCULAR | Status: AC
  Administered 2020-05-30: 125 mg via INTRAVENOUS
  Filled 2020-05-30: qty 2

## 2020-05-30 MED ORDER — PREDNISONE 20 MG PO TABS: 40.0000 mg | ORAL_TABLET | Freq: Every day | ORAL | 0 refills | Status: AC

## 2020-05-30 MED ORDER — ALBUTEROL SULFATE (2.5 MG/3ML) 0.083% IN NEBU
2.5000 mg | INHALATION_SOLUTION | Freq: Once | RESPIRATORY_TRACT | Status: AC
  Administered 2020-05-30: 2.5 mg via RESPIRATORY_TRACT
  Filled 2020-05-30: qty 3

## 2020-05-30 MED ORDER — IPRATROPIUM BROMIDE 0.02 % IN SOLN: 0.5000 mg | Freq: Once | RESPIRATORY_TRACT | Status: DC

## 2020-05-30 NOTE — ED Notes (Signed)
Spoke to pt husband and updated per request

## 2020-05-30 NOTE — ED Triage Notes (Signed)
Pt. Reports she started having wheezing on Thurs. Night and she has been pumping the inhaler since then constantly due to the shortness of breath. Pt. Reports today she started to have pain in the shoulder blade and between her shoulder blades at approx. 3:30pm.  Pt.reprots in the mid chest she has felt tightness today also.  Pt. Is breathing expressively heavy in triage.  Taking Pt. To room 9 to do EKG and finish her triage.

## 2020-05-30 NOTE — ED Notes (Signed)
EDP at bedside  

## 2020-05-30 NOTE — ED Notes (Signed)
Pt ambulated around the  Nurses station with out any complications. Once in the room Pt was placed on monitor, O2 sats 99% and heart rate was 108.

## 2020-05-30 NOTE — ED Notes (Signed)
RT STAFF AT BEDSIDE

## 2020-05-30 NOTE — ED Provider Notes (Signed)
[facility] EMERGENCY DEPARTMENT Provider Note   CSN: [id number] Arrival date & time: 05/30/20  1832     History Chief Complaint  Patient presents with  . Shortness of Breath    Annette Doyle is a 55 y.o. female.  Patient is a 55 year old female with a history of hypertension, pancytopenia, asthma who is presenting today with shortness of breath.  She reports symptoms started Thursday night into Friday morning.  She has had shortness of breath, chest tightness, wheezing, cough with only minimal sputum production.  She has been using her albuterol pump at home with only minimal improvement.  The symptoms worsened today she reports she is just not been able to catch her breath.  Any activity makes the symptoms worse.  She has not had fever, vomiting or diarrhea.  She did have Covid approximately 1 month ago but reports she just had lost her smell and was fatigued.  She has not had any lower extremity edema but did notice today she was having some discomfort in her back with breathing that is still present between her shoulder blades.  No history of heart problems and no recent medication changes.  She is not on an inhaler daily for symptom control.  The history is provided by the patient.  Shortness of Breath      Past Medical History:  Diagnosis Date  . Panic attacks   . Seizures [facility])     Patient Active Problem List   Diagnosis Date Noted  . Influenza-like illness 06/30/2014  . SIRS (systemic inflammatory response syndrome) (HCC) 06/30/2014  . ARF (acute renal failure) (HCC) 06/30/2014  . Essential hypertension 06/30/2014  . Seizure (HCC) 06/30/2014  . Pancytopenia (HCC) 06/30/2014    Past Surgical History:  Procedure Laterality Date  . CESAREAN SECTION    . COLON SURGERY       OB History   No obstetric history on file.     Family History  Problem Relation Age of Onset  . Diabetes Mellitus II Mother   . Hypertension Mother     Social History    Tobacco Use  . Smoking status: Never Smoker  Substance Use Topics  . Alcohol use: No  . Drug use: No    Home Medications Prior to Admission medications   Medication Sig Start Date End Date Taking? Authorizing Provider  fluticasone (FLONASE) 50 MCG/ACT nasal spray Place 2 sprays into both nostrils daily.   Yes [provider]  guaiFENesin (MUCINEX) 600 MG 12 hr tablet Take 600 mg by mouth daily. Just started back to taking   Yes [provider]  lisinopril-hydrochlorothiazide (PRINZIDE,ZESTORETIC) 20-25 MG per tablet Take 1 tablet by mouth daily. HOLD FOR 1 WEEK 07/02/14  Yes [name], MD  loratadine (CLARITIN REDITABS) 10 MG dissolvable tablet Take 10 mg by mouth daily.   Yes [provider]  montelukast (SINGULAIR) 10 MG tablet Take 10 mg by mouth.   Yes [provider]  acetaminophen (TYLENOL) 325 MG tablet Take 2 tablets (650 mg total) by mouth every 6 (six) hours as needed for mild pain (or Fever >/= 101). 07/02/14   [name], MD  ALPRAZolam Prudy Feeler) 0.5 MG tablet Take 0.5 mg by mouth daily as needed for anxiety.    [provider]  carbamazepine (TEGRETOL XR) 200 MG 12 hr tablet Take 200-400 mg by mouth 2 (two) times daily. pt. takes 200 mg in AM, 400 mg in PM    [provider]  omeprazole (PRILOSEC) 20 MG capsule Take 20 mg by mouth daily as needed (indegistion).    [provider]  oseltamivir (TAMIFLU) 30 MG capsule Take 1 capsule (30 mg total) by mouth 2 (two) times daily. 07/02/14   [name], MD    Allergies    Tegretol [carbamazepine]  Review of Systems   Review of Systems  Respiratory: Positive for shortness of breath.   All other systems reviewed and are negative.   Physical Exam Updated Vital Signs BP (!) 134/98 (BP Location: Right Arm)   Pulse (!) 107   Temp 98 F (36.7 C) (Oral)   Resp (!) 24   Ht 5\' 5"  (1.651 m)   Wt 78.5 kg   SpO2 100%   BMI 28.79 kg/m    Physical Exam Vitals and nursing note reviewed.  Constitutional:      General: She is in acute distress.     Appearance: She is well-developed and well-nourished.  HENT:     Head: Normocephalic and atraumatic.  Eyes:     Extraocular Movements: EOM normal.     Pupils: Pupils are equal, round, and reactive to light.  Cardiovascular:     Rate and Rhythm: Regular rhythm. Tachycardia present.     Pulses: Intact distal pulses.     Heart sounds: Normal heart sounds. No murmur heard. No friction rub.  Pulmonary:     Effort: Tachypnea and accessory muscle usage present.     Breath sounds: Wheezing present. No rales.  Abdominal:     General: Bowel sounds are normal. There is no distension.     Palpations: Abdomen is soft.     Tenderness: There is no abdominal tenderness. There is no guarding or rebound.  Musculoskeletal:        General: No tenderness. Normal range of motion.     Right lower leg: No tenderness. No edema.     Left lower leg: No tenderness. No edema.     Comments: No edema  Skin:    General: Skin is warm and dry.     Findings: No rash.  Neurological:     Mental Status: She is alert and oriented to person, place, and time.     Cranial Nerves: No cranial nerve deficit.  Psychiatric:        Mood and Affect: Mood and affect normal.        Behavior: Behavior normal.     ED Results / Procedures / Treatments   Labs (all labs ordered are listed, but only abnormal results are displayed) Labs Reviewed  CBC WITH DIFFERENTIAL/PLATELET - Abnormal; Notable for the following components:      Result Value   Eosinophils Absolute 0.9 (*)    All other components within normal limits  BASIC METABOLIC PANEL - Abnormal; Notable for the following components:   Creatinine, Ser 1.23 (*)    GFR, Estimated 53 (*)    All other components within normal limits    EKG EKG Interpretation  Date/Time:  Sunday May 30 2020 18:48:18 EST Ventricular Rate:  104 PR Interval:    QRS  Duration: 86 QT Interval:  350 QTC Calculation: 461 R Axis:   -29 Text Interpretation: Sinus tachycardia Borderline left axis deviation No significant change since last tracing Confirmed by [id number] (Gwyneth Sprout) on 05/30/2020 7:11:34 PM   Radiology DG Chest Port 1 View  Result Date: 05/30/2020 CLINICAL DATA:  Wheezing and shortness of breath x4 days. EXAM: PORTABLE CHEST 1 VIEW COMPARISON:  June 30, 2014  FINDINGS: The heart size and mediastinal contours are within normal limits. Both lungs are clear. The visualized skeletal structures are unremarkable. IMPRESSION: No active disease. Electronically Signed   By: Aram Candela M.D.   On: 05/30/2020 19:30    Procedures Procedures   Medications Ordered in ED Medications  albuterol (PROVENTIL) (2.5 MG/3ML) 0.083% nebulizer solution 5 mg (has no administration in time range)  ipratropium (ATROVENT) nebulizer solution 0.5 mg (has no administration in time range)  methylPREDNISolone sodium succinate (SOLU-MEDROL) 125 mg/2 mL injection 125 mg (has no administration in time range)  magnesium sulfate IVPB 2 g 50 mL (has no administration in time range)    ED Course  I have reviewed the triage vital signs and the nursing notes.  Pertinent labs & imaging results that were available during my care of the patient were reviewed by me and considered in my medical decision making (see chart for details).    MDM Rules/Calculators/A&P                          55 year old female presenting today with shortness of breath.  Patient has evidence of some respiratory distress on exam but is satting 100% on room air.  She has tachypnea and wheezing throughout with some accessory muscle use.  She does have a prior history of asthma.  She has never been a smoker.  She was using her inhaler at home but is just stopped working.  She did have Covid approximately 1 month ago but seemed to do very well with it without any respiratory issues.  Will give  Solu-Medrol, magnesium, albuterol and Atrovent.  Chest x-ray and lab work pending as patient has had a prior history of pancytopenia.  Will ensure no evidence of anemia today.  EKG showed sinus tachycardia but no other acute findings.   8:27 PM CXR wnl. Lab work is reassuring and normal.  On re-evaluation but is starting to feel better.  On her second duo-neb.  Wheezing much improved.  9:13 PM Pt still feeling well.  Ambulated here without return of symptoms.  Will d/c home with prednisone and f/u with pcp.  She already has albuterol at home.  MDM Number of Diagnoses or Management Options   Amount and/or Complexity of Data Reviewed Clinical lab tests: ordered and reviewed Tests in the radiology section of CPT: ordered and reviewed Tests in the medicine section of CPT: ordered and reviewed Decide to obtain previous medical records or to obtain history from someone other than the patient: yes Obtain history from someone other than the patient: no Review and summarize past medical records: yes Discuss the patient with other providers: no Independent visualization of images, tracings, or specimens: yes  Risk of Complications, Morbidity, and/or Mortality Presenting problems: high Diagnostic procedures: low Management options: moderate  Patient Progress Patient progress: improved    Final Clinical Impression(s) / ED Diagnoses Final diagnoses:  Severe persistent asthma with exacerbation    Rx / DC Orders ED Discharge Orders         Ordered    predniSONE (DELTASONE) 20 MG tablet  Daily        05/30/20 2120           Gwyneth Sprout, MD 05/30/20 2121

## 2020-06-29 ENCOUNTER — Emergency Department (HOSPITAL_BASED_OUTPATIENT_CLINIC_OR_DEPARTMENT_OTHER)
Admission: EM | Admit: 2020-06-29 | Discharge: 2020-06-29 | Disposition: A | Discharge status: Home / Self Care | Payer: Managed Care, Other (non HMO) | Attending: Emergency Medicine | Admitting: Emergency Medicine

## 2020-06-29 ENCOUNTER — Other Ambulatory Visit: Payer: Self-pay

## 2020-06-29 ENCOUNTER — Encounter (HOSPITAL_BASED_OUTPATIENT_CLINIC_OR_DEPARTMENT_OTHER): Payer: Self-pay | Admitting: Emergency Medicine

## 2020-06-29 DIAGNOSIS — I10 Essential (primary) hypertension: Secondary | ICD-10-CM | POA: Diagnosis not present

## 2020-06-29 DIAGNOSIS — M79672 Pain in left foot: Secondary | ICD-10-CM | POA: Diagnosis not present

## 2020-06-29 DIAGNOSIS — R252 Cramp and spasm: Secondary | ICD-10-CM

## 2020-06-29 DIAGNOSIS — M79605 Pain in left leg: Secondary | ICD-10-CM | POA: Diagnosis not present

## 2020-06-29 DIAGNOSIS — M79604 Pain in right leg: Secondary | ICD-10-CM | POA: Insufficient documentation

## 2020-06-29 DIAGNOSIS — M79671 Pain in right foot: Secondary | ICD-10-CM | POA: Insufficient documentation

## 2020-06-29 DIAGNOSIS — J45909 Unspecified asthma, uncomplicated: Secondary | ICD-10-CM | POA: Diagnosis not present

## 2020-06-29 DIAGNOSIS — Z79899 Other long term (current) drug therapy: Secondary | ICD-10-CM | POA: Insufficient documentation

## 2020-06-29 DIAGNOSIS — Z7951 Long term (current) use of inhaled steroids: Secondary | ICD-10-CM | POA: Diagnosis not present

## 2020-06-29 HISTORY — DX: Unspecified asthma, uncomplicated: J45.909

## 2020-06-29 LAB — BASIC METABOLIC PANEL
Anion gap: 11 (ref 5–15)
BUN: 23 mg/dL — ABNORMAL HIGH (ref 6–20)
CO2: 22 mmol/L (ref 22–32)
Calcium: 9.3 mg/dL (ref 8.9–10.3)
Chloride: 102 mmol/L (ref 98–111)
Creatinine, Ser: 1.14 mg/dL — ABNORMAL HIGH (ref 0.44–1.00)
GFR, Estimated: 58 mL/min — ABNORMAL LOW (ref >60)
Glucose, Bld: 127 mg/dL — ABNORMAL HIGH (ref 70–99)
Potassium: 5.1 mmol/L (ref 3.5–5.1)
Sodium: 135 mmol/L (ref 135–145)

## 2020-06-29 LAB — MAGNESIUM: Magnesium: 2.2 mg/dL (ref 1.7–2.4)

## 2020-06-29 MED ORDER — ADULT MULTIVITAMIN W/MINERALS CH
1.0000 | ORAL_TABLET | Freq: Once | ORAL | Status: AC
  Administered 2020-06-29: 1 via ORAL
  Filled 2020-06-29: qty 1

## 2020-06-29 MED ORDER — METHOCARBAMOL 500 MG PO TABS
1000.0000 mg | ORAL_TABLET | Freq: Once | ORAL | Status: AC
  Administered 2020-06-29: 1000 mg via ORAL
  Filled 2020-06-29: qty 2

## 2020-06-29 MED ORDER — LIDOCAINE 5 % EX PTCH
2.0000 | MEDICATED_PATCH | CUTANEOUS | Status: DC
  Administered 2020-06-29: 2 via TRANSDERMAL
  Filled 2020-06-29: qty 2

## 2020-06-29 MED ORDER — LIDOCAINE 5 % EX PTCH: 1.0000 | MEDICATED_PATCH | CUTANEOUS | 0 refills | Status: AC

## 2020-06-29 MED ORDER — ACETAMINOPHEN 500 MG PO TABS
1000.0000 mg | ORAL_TABLET | Freq: Once | ORAL | Status: AC
  Administered 2020-06-29: 1000 mg via ORAL
  Filled 2020-06-29: qty 2

## 2020-06-29 NOTE — ED Provider Notes (Signed)
[facility] EMERGENCY DEPARTMENT Provider Note   CSN: [id number] Arrival date & time: 06/29/20  0423     History Chief Complaint  Patient presents with  . Leg Pain    Annette Doyle is a 55 y.o. female.  The history is provided by the patient.  Leg Pain Location:  Leg and foot Time since incident:  3 days Injury: no   Leg location:  L leg and R leg Foot location:  L foot and R foot Pain details:    Quality:  Cramping   Radiates to:  Does not radiate   Severity:  Mild   Onset quality:  Gradual   Duration:  3 days   Timing:  Constant   Progression:  Waxing and waning Chronicity:  New Dislocation: no   Foreign body present:  No foreign bodies Prior injury to area:  No Relieved by:  Nothing Worsened by:  Nothing Ineffective treatments:  None tried Associated symptoms: no back pain, no decreased ROM, no fatigue, no fever, no itching, no muscle weakness, no neck pain, no numbness, no stiffness, no swelling and no tingling   Risk factors: no concern for non-accidental trauma   Patient presents with leg cramps for 3 days.  No f/c/r.  No exercise.  No trauma.  No travel.  No leg swelling.       Past Medical History:  Diagnosis Date  . Asthma   . Panic attacks   . Seizures [facility])     Patient Active Problem List   Diagnosis Date Noted  . Influenza-like illness 06/30/2014  . SIRS (systemic inflammatory response syndrome) (HCC) 06/30/2014  . ARF (acute renal failure) (HCC) 06/30/2014  . Essential hypertension 06/30/2014  . Seizure (HCC) 06/30/2014  . Pancytopenia (HCC) 06/30/2014    Past Surgical History:  Procedure Laterality Date  . CESAREAN SECTION    . COLON SURGERY       OB History   No obstetric history on file.     Family History  Problem Relation Age of Onset  . Diabetes Mellitus II Mother   . Hypertension Mother     Social History   Tobacco Use  . Smoking status: Never Smoker  Substance Use Topics  . Alcohol use: No  . Drug use:  No    Home Medications Prior to Admission medications   Medication Sig Start Date End Date Taking? Authorizing Provider  lidocaine (LIDODERM) 5 % Place 1 patch onto the skin daily. Remove & Discard patch within 12 hours or as directed by MD 06/29/20  Yes Palumbo, April, MD  acetaminophen (TYLENOL) 325 MG tablet Take 2 tablets (650 mg total) by mouth every 6 (six) hours as needed for mild pain (or Fever >/= 101). 07/02/14   [name], MD  ALPRAZolam Prudy Feeler) 0.5 MG tablet Take 0.5 mg by mouth daily as needed for anxiety.    [provider]  carbamazepine (TEGRETOL XR) 200 MG 12 hr tablet Take 200-400 mg by mouth 2 (two) times daily. pt. takes 200 mg in AM, 400 mg in PM    [provider]  fluticasone (FLONASE) 50 MCG/ACT nasal spray Place 2 sprays into both nostrils daily.    [provider]  guaiFENesin (MUCINEX) 600 MG 12 hr tablet Take 600 mg by mouth daily. Just started back to taking    [provider]  lisinopril-hydrochlorothiazide (PRINZIDE,ZESTORETIC) 20-25 MG per tablet Take 1 tablet by mouth daily. HOLD FOR 1 WEEK 07/02/14   [name],  MD  loratadine (CLARITIN REDITABS) 10 MG dissolvable tablet Take 10 mg by mouth daily.    [provider]  montelukast (SINGULAIR) 10 MG tablet Take 10 mg by mouth.    [provider]  omeprazole (PRILOSEC) 20 MG capsule Take 20 mg by mouth daily as needed (indegistion).    [provider]  oseltamivir (TAMIFLU) 30 MG capsule Take 1 capsule (30 mg total) by mouth 2 (two) times daily. 07/02/14   [name], MD  predniSONE (DELTASONE) 20 MG tablet Take 2 tablets (40 mg total) by mouth daily. 05/30/20   Gwyneth Sprout, MD    Allergies    Tegretol [carbamazepine]  Review of Systems   Review of Systems  Constitutional: Negative for fatigue and fever.  HENT: Negative for congestion.   Eyes: Negative for visual disturbance.  Respiratory: Negative for shortness of  breath.   Cardiovascular: Negative for chest pain and leg swelling.  Gastrointestinal: Negative for abdominal pain.  Genitourinary: Negative for difficulty urinating.  Musculoskeletal: Negative for back pain, joint swelling, neck pain and stiffness.  Skin: Negative for itching, rash and wound.  Neurological: Negative for dizziness.  Psychiatric/Behavioral: Negative for agitation.  All other systems reviewed and are negative.   Physical Exam Updated Vital Signs BP (!) 144/104   Pulse 94   Temp 99.1 F (37.3 C) (Oral)   Resp 20   Ht 5\' 5"  (1.651 m)   Wt 79.4 kg   LMP  (LMP Unknown)   SpO2 99%   BMI 29.12 kg/m   Physical Exam Vitals and nursing note reviewed.  Constitutional:      Appearance: Normal appearance. She is not ill-appearing or diaphoretic.  HENT:     Head: Normocephalic and atraumatic.     Nose: Nose normal.  Eyes:     Conjunctiva/sclera: Conjunctivae normal.     Pupils: Pupils are equal, round, and reactive to light.  Cardiovascular:     Rate and Rhythm: Normal rate and regular rhythm.     Pulses: Normal pulses.     Heart sounds: Normal heart sounds.  Pulmonary:     Effort: Pulmonary effort is normal.     Breath sounds: Normal breath sounds.  Abdominal:     General: Abdomen is flat. Bowel sounds are normal.     Palpations: Abdomen is soft.     Tenderness: There is no abdominal tenderness. There is no guarding.  Musculoskeletal:        General: No swelling, tenderness or deformity. Normal range of motion.     Cervical back: Normal range of motion and neck supple.     Right upper leg: Normal.     Left upper leg: Normal.     Right knee: Normal.     Left knee: Normal.     Right lower leg: Normal. No tenderness or bony tenderness. No edema.     Left lower leg: Normal. No tenderness or bony tenderness. No edema.     Right ankle: Normal.     Right Achilles Tendon: Normal.     Left ankle: Normal.     Left Achilles Tendon: Normal.     Right foot: Normal.      Left foot: Normal.  Skin:    General: Skin is warm and dry.     Capillary Refill: Capillary refill takes less than 2 seconds.  Neurological:     General: No focal deficit present.     Mental Status: She is alert and oriented to person, place, and  time.     Deep Tendon Reflexes: Reflexes normal.  Psychiatric:        Mood and Affect: Mood is anxious.     ED Results / Procedures / Treatments   Labs (all labs ordered are listed, but only abnormal results are displayed) Labs Reviewed  BASIC METABOLIC PANEL  MAGNESIUM    EKG None  Radiology No results found.  Procedures Procedures   Medications Ordered in ED Medications  lidocaine (LIDODERM) 5 % 2 patch (2 patches Transdermal Patch Applied 06/29/20 0441)  acetaminophen (TYLENOL) tablet 1,000 mg (1,000 mg Oral Given 06/29/20 0440)  methocarbamol (ROBAXIN) tablet 1,000 mg (1,000 mg Oral Given 06/29/20 0439)  multivitamin with minerals tablet 1 tablet (1 tablet Oral Given 06/29/20 0440)    ED Course  I have reviewed the triage vital signs and the nursing notes.  Pertinent labs & imaging results that were available during my care of the patient were reviewed by me and considered in my medical decision making (see chart for details).  Cramps likely brought on by panic.  Patient is actively hyperventilating and I have instructed the patient to slow her breathing.  Patient is stable for discharge with close follow up.    Annette Doyle was evaluated in Emergency Department on 06/29/2020 for the symptoms described in the history of present illness. She was evaluated in the context of the global COVID-19 pandemic, which necessitated consideration that the patient might be at risk for infection with the SARS-CoV-2 virus that causes COVID-19. Institutional protocols and algorithms that pertain to the evaluation of patients at risk for COVID-19 are in a state of rapid change based on information released by regulatory bodies including the CDC  and federal and state organizations. These policies and algorithms were followed during the patient's care in the ED.  Final Clinical Impression(s) / ED Diagnoses Final diagnoses:  None   Return for intractable cough, coughing up blood, fevers >100.4 unrelieved by medication, shortness of breath, intractable vomiting, chest pain, shortness of breath, weakness, numbness, changes in speech, facial asymmetry, abdominal pain, passing out, Inability to tolerate liquids or food, cough, altered mental status or any concerns. No signs of systemic illness or infection. The patient is nontoxic-appearing on exam and vital signs are within normal limits.  I have reviewed the triage vital signs and the nursing notes. Pertinent labs & imaging results that were available during my care of the patient were reviewed by me and considered in my medical decision making (see chart for details). After history, exam, and medical workup I feel the patient has been appropriately medically screened and is safe for discharge home. Pertinent diagnoses were discussed with the patient. Patient was given return precautions.    Rx / DC Orders ED Discharge Orders         Ordered    lidocaine (LIDODERM) 5 %  Every 24 hours        06/29/20 0444           Palumbo, April, MD 06/29/20 2229

## 2020-06-29 NOTE — ED Triage Notes (Signed)
Pt c/o muscles cramps all over worse in legs x 2-3 days.
